# Patient Record
Sex: Male | Born: 1975 | Race: White | Hispanic: No | Marital: Married | Smoking: Never smoker
Health system: Southern US, Community
[De-identification: ages and names within clinical notes are randomized; demographics above are authoritative.]

## PROBLEM LIST (undated history)

## (undated) DIAGNOSIS — R569 Unspecified convulsions: Secondary | ICD-10-CM

## (undated) DIAGNOSIS — E785 Hyperlipidemia, unspecified: Secondary | ICD-10-CM

## (undated) HISTORY — PX: VASECTOMY: SHX75

## (undated) HISTORY — DX: Hyperlipidemia, unspecified: E78.5

## (undated) HISTORY — DX: Unspecified convulsions: R56.9

---

## 2013-05-02 ENCOUNTER — Ambulatory Visit (INDEPENDENT_AMBULATORY_CARE_PROVIDER_SITE_OTHER): Payer: Managed Care, Other (non HMO) | Admitting: Family Medicine

## 2013-05-02 ENCOUNTER — Encounter: Payer: Self-pay | Admitting: Family Medicine

## 2013-05-02 VITALS — BP 116/72 | HR 61 | Temp 98.3°F | Ht 66.0 in | Wt 173.4 lb

## 2013-05-02 DIAGNOSIS — Z0000 Encounter for general adult medical examination without abnormal findings: Secondary | ICD-10-CM

## 2013-05-02 LAB — POCT CBC
Granulocyte percent: 53.8 %G (ref 37–80)
HCT, POC: 43.4 % — AB (ref 43.5–53.7)
Hemoglobin: 14.9 g/dL (ref 14.1–18.1)
Lymph, poc: 2.3 (ref 0.6–3.4)
MCH, POC: 29.1 pg (ref 27–31.2)
MCHC: 34.3 g/dL (ref 31.8–35.4)
MCV: 84.8 fL (ref 80–97)
MPV: 8.4 fL (ref 0–99.8)
POC Granulocyte: 2.9 (ref 2–6.9)
POC LYMPH PERCENT: 42.9 %L (ref 10–50)
Platelet Count, POC: 240 10*3/uL (ref 142–424)
RBC: 5.1 M/uL (ref 4.69–6.13)
RDW, POC: 12.7 %
WBC: 5.3 10*3/uL (ref 4.6–10.2)

## 2013-05-02 NOTE — Progress Notes (Signed)
  Subjective:    Patient ID: Shaun Garcia, male    DOB: Aug 23, 1975, 37 y.o.   MRN: [id number]  HPI  This 37 y.o. male presents for evaluation of annual exam.  He has no acute complaints.  Review of Systems    No chest pain, SOB, HA, dizziness, vision change, N/V, diarrhea, constipation, dysuria, urinary urgency or frequency, myalgias, arthralgias or rash.  Objective:   Physical Exam  Vital signs noted  Well developed well nourished male.  HEENT - Head atraumatic Normocephalic                Eyes - PERRLA, Conjuctiva - clear Sclera- Clear EOMI                Ears - EAC's Wnl TM's Wnl Gross Hearing WNL                Nose - Nares patent                 Throat - oropharanx wnl Respiratory - Lungs CTA bilateral Cardiac - RRR S1 and S2 without murmur GI - Abdomen soft Nontender and bowel sounds active x 4 Extremities - No edema. Neuro - Grossly intact.      Assessment & Plan:  Routine general medical examination at a health care facility - Plan: POCT CBC, CMP14+EGFR, Lipid panel, Thyroid Panel With TSH

## 2013-05-02 NOTE — Patient Instructions (Signed)
Testicular Problems and Self-Exam   Men can examine themselves easily and effectively with positive results. Monthly exams detect problems early and save lives. There are numerous causes of swelling in the testicle. Testicular cancer usually appears as a firm painless lump in the front part of the testicle. This may feel like a dull ache or heavy feeling located in the lower abdomen (belly), groin, or scrotum.   The risk is greater in men with undescended testicles and it is more common in young men. It is responsible for almost a fifth of cancers in males between ages 15 and 34. Other common causes of swellings, lumps, and testicular pain include injuries, inflammation (soreness) from infection, hydrocele, and torsion. These are a few of the reasons to do monthly self-examination of the testicles. The exam only takes minutes and could add years to your life. Get in the habit!   SELF-EXAMINATION OF THE TESTICLES   The testicles are easiest to examine after warm baths or showers and are more difficult to examine when you are cold. This is because the muscles attached to the testicles retract and pull them up higher or into the abdomen. While standing, roll one testicle between the thumb and forefinger. Feel for lumps, swelling, or discomfort. A normal testicle is egg shaped and feels firm. It is smooth and not tender. The spermatic cord can be felt as a firm spaghetti-like cord at the back of the testicle. It is also important to examine your groins. This is the crease between the front of your leg and your abdomen. Also, feel for enlarged lymph nodes (glands). Enlarged nodes are also a cause for you to see your caregiver for evaluation.   Self-examination of the testicles and groin areas on a regular basis will help you to know what your own testicles and groins feel like. This will help you pick up an abnormality (difference) at an earlier stage. Early discovery is the key to curing this cancer or treating other  conditions. Any lump, change, or swelling in the testicle calls for immediate evaluation by your caregiver. Cancer of the testicle does not result in impotence and it does not prevent normal intercourse or prevent having children. If your caregiver feels that medical treatment or chemotherapy could lead to infertility, sperm can be frozen for future use. It is necessary to see a caregiver as soon as possible after the discovery of a lump in a testicle.   Document Released: 11/07/2000 Document Revised: 10/24/2011 Document Reviewed: 08/02/2008   [employer]® Patient Information ©2014 [employer].

## 2013-05-03 LAB — THYROID PANEL WITH TSH
Free Thyroxine Index: 1.6 (ref 1.2–4.9)
T3 Uptake Ratio: 32 % (ref 24–39)
T4, Total: 4.9 ug/dL (ref 4.5–12.0)
TSH: 0.697 u[IU]/mL (ref 0.450–4.500)

## 2013-05-03 LAB — CMP14+EGFR
ALT: 30 IU/L (ref 0–44)
AST: 26 IU/L (ref 0–40)
Albumin/Globulin Ratio: 1.9 (ref 1.1–2.5)
Albumin: 4.7 g/dL (ref 3.5–5.5)
Alkaline Phosphatase: 42 IU/L (ref 39–117)
BUN/Creatinine Ratio: 14 (ref 8–19)
BUN: 15 mg/dL (ref 6–20)
CO2: 28 mmol/L (ref 18–29)
Calcium: 10.1 mg/dL (ref 8.7–10.2)
Chloride: 99 mmol/L (ref 97–108)
Creatinine, Ser: 1.08 mg/dL (ref 0.76–1.27)
GFR calc Af Amer: 102 mL/min/{1.73_m2} (ref >59)
GFR calc non Af Amer: 88 mL/min/{1.73_m2} (ref >59)
Globulin, Total: 2.5 g/dL (ref 1.5–4.5)
Glucose: 73 mg/dL (ref 65–99)
Potassium: 4.4 mmol/L (ref 3.5–5.2)
Sodium: 143 mmol/L (ref 134–144)
Total Bilirubin: 0.4 mg/dL (ref 0.0–1.2)
Total Protein: 7.2 g/dL (ref 6.0–8.5)

## 2013-05-03 LAB — LIPID PANEL
Chol/HDL Ratio: 5.5 ratio units — ABNORMAL HIGH (ref 0.0–5.0)
Cholesterol, Total: 249 mg/dL — ABNORMAL HIGH (ref 100–199)
HDL: 45 mg/dL (ref >39)
LDL Calculated: 148 mg/dL — ABNORMAL HIGH (ref 0–99)
Triglycerides: 280 mg/dL — ABNORMAL HIGH (ref 0–149)
VLDL Cholesterol Cal: 56 mg/dL — ABNORMAL HIGH (ref 5–40)

## 2013-05-07 ENCOUNTER — Encounter: Payer: Self-pay | Admitting: *Deleted

## 2013-05-10 ENCOUNTER — Telehealth: Payer: Self-pay | Admitting: Family Medicine

## 2013-05-10 NOTE — Telephone Encounter (Signed)
Pt notified and lab results left on his answering machine as requested.

## 2014-02-05 ENCOUNTER — Encounter: Payer: Self-pay | Admitting: Family Medicine

## 2014-02-05 ENCOUNTER — Ambulatory Visit (INDEPENDENT_AMBULATORY_CARE_PROVIDER_SITE_OTHER): Payer: BC Managed Care – PPO | Admitting: Family Medicine

## 2014-02-05 VITALS — BP 115/74 | HR 70 | Temp 99.1°F | Ht 66.0 in | Wt 174.2 lb

## 2014-02-05 DIAGNOSIS — H109 Unspecified conjunctivitis: Secondary | ICD-10-CM

## 2014-02-05 DIAGNOSIS — J069 Acute upper respiratory infection, unspecified: Secondary | ICD-10-CM

## 2014-02-05 MED ORDER — METHYLPREDNISOLONE ACETATE 80 MG/ML IJ SUSP
80.0000 mg | Freq: Once | INTRAMUSCULAR | Status: AC
  Administered 2014-02-05: 80 mg via INTRAMUSCULAR

## 2014-02-05 MED ORDER — POLYMYXIN B-TRIMETHOPRIM 10000-0.1 UNIT/ML-% OP SOLN: 1.0000 [drp] | OPHTHALMIC | Status: DC

## 2014-02-05 MED ORDER — AZITHROMYCIN 250 MG PO TABS: ORAL_TABLET | ORAL | Status: DC

## 2014-02-05 NOTE — Progress Notes (Signed)
   Subjective:    Patient ID: Caius Silbernagel, male    DOB: [birth year], 38 y.o.   MRN: [id number]  HPI This 38 y.o. male presents for evaluation of bilateral eye redness, uri sx's, and fatigue.   Review of Systems C/o uri sx's No chest pain, SOB, HA, dizziness, vision change, N/V, diarrhea, constipation, dysuria, urinary urgency or frequency, myalgias, arthralgias or rash.     Objective:   Physical Exam  Vital signs noted  Well developed well nourished male.  HEENT - Head atraumatic Normocephalic                Eyes - PERRLA, Conjuctiva - injected Sclera- Clear EOMI peri-orbital edema                Ears - EAC's Wnl TM's Wnl Gross Hearing WNL                Nose - Nares patent                 Throat - oropharanx wnl Respiratory - Lungs CTA bilateral Cardiac - RRR S1 and S2 without murmur GI - Abdomen soft Nontender and bowel sounds active x 4      Assessment & Plan:  Bilateral conjunctivitis - Plan: methylPREDNISolone acetate (DEPO-MEDROL) injection 80 mg, azithromycin (ZITHROMAX) 250 MG tablet, trimethoprim-polymyxin b (POLYTRIM) ophthalmic solution  URI (upper respiratory infection) - Plan: methylPREDNISolone acetate (DEPO-MEDROL) injection 80 mg, azithromycin (ZITHROMAX) 250 MG tablet, trimethoprim-polymyxin b (POLYTRIM) ophthalmic solution  Push po fluids, rest, tylenol and motrin otc prn as directed for fever, arthralgias, and myalgias.  Follow up prn if sx's continue or persist.  Lysbeth Penner FNP

## 2014-12-02 ENCOUNTER — Encounter: Payer: Self-pay | Admitting: Family Medicine

## 2014-12-02 ENCOUNTER — Ambulatory Visit (INDEPENDENT_AMBULATORY_CARE_PROVIDER_SITE_OTHER): Payer: BLUE CROSS/BLUE SHIELD | Admitting: Family Medicine

## 2014-12-02 VITALS — BP 122/79 | HR 58 | Temp 98.0°F | Ht 66.0 in | Wt 177.0 lb

## 2014-12-02 DIAGNOSIS — Z23 Encounter for immunization: Secondary | ICD-10-CM

## 2014-12-02 DIAGNOSIS — Z0000 Encounter for general adult medical examination without abnormal findings: Secondary | ICD-10-CM

## 2014-12-02 DIAGNOSIS — E785 Hyperlipidemia, unspecified: Secondary | ICD-10-CM

## 2014-12-02 DIAGNOSIS — R569 Unspecified convulsions: Secondary | ICD-10-CM | POA: Insufficient documentation

## 2014-12-02 DIAGNOSIS — E559 Vitamin D deficiency, unspecified: Secondary | ICD-10-CM

## 2014-12-02 NOTE — Progress Notes (Signed)
Subjective:  Patient ID: Ronnel Zuercher, male    DOB: 04-Sep-1975  Age: 39 y.o. MRN: [id number]  CC: Annual Exam   HPI Darriel Utter presents for annual exam  History Rowland has a past medical history of Seizure (last 2002).   He has past surgical history that includes Vasectomy.   His family history includes Heart disease in his mother; Hypertension in his father.He reports that he has never smoked. His smokeless tobacco use includes Snuff. He reports that he drinks alcohol. He reports that he does not use illicit drugs.  No current outpatient prescriptions on file prior to visit.   No current facility-administered medications on file prior to visit.    ROS Review of Systems  Constitutional: Negative for fever, chills, diaphoresis, activity change, appetite change, fatigue and unexpected weight change.  HENT: Negative for congestion, ear pain, hearing loss, postnasal drip, rhinorrhea, sore throat, tinnitus and trouble swallowing.   Eyes: Negative for photophobia, pain, discharge and redness.  Respiratory: Negative for apnea, cough, choking, chest tightness, shortness of breath, wheezing and stridor.   Cardiovascular: Negative for chest pain, palpitations and leg swelling.  Gastrointestinal: Negative for nausea, vomiting, abdominal pain, diarrhea, constipation, blood in stool and abdominal distention.  Endocrine: Negative for cold intolerance, heat intolerance, polydipsia, polyphagia and polyuria.  Genitourinary: Negative for dysuria, urgency, frequency, hematuria, flank pain, enuresis, difficulty urinating and genital sores.  Musculoskeletal: Negative for joint swelling and arthralgias.  Skin: Negative for color change, rash and wound.  Allergic/Immunologic: Negative for immunocompromised state.  Neurological: Negative for dizziness, tremors, seizures, syncope, facial asymmetry, speech difficulty, weakness, light-headedness, numbness and headaches.  Hematological: Does not  bruise/bleed easily.  Psychiatric/Behavioral: Negative for suicidal ideas, hallucinations, behavioral problems, confusion, sleep disturbance, dysphoric mood, decreased concentration and agitation. The patient is not nervous/anxious and is not hyperactive.     Objective:  BP 122/79 mmHg  Pulse 58  Temp(Src) 98 F (36.7 C) (Oral)  Ht _0  (1.676 m)  Wt 177 lb (80.287 kg)  BMI 28.58 kg/m2  Physical Exam  Constitutional: He is oriented to person, place, and time. He appears well-developed and well-nourished.  HENT:  Head: Normocephalic and atraumatic.  Mouth/Throat: Oropharynx is clear and moist.  Eyes: EOM are normal. Pupils are equal, round, and reactive to light.  Neck: Normal range of motion. No tracheal deviation present. No thyromegaly present.  Cardiovascular: Normal rate, regular rhythm and normal heart sounds.  Exam reveals no gallop and no friction rub.   No murmur heard. Pulmonary/Chest: Breath sounds normal. He has no wheezes. He has no rales.  Abdominal: Soft. He exhibits no mass. There is no tenderness. There is no rebound and no guarding.  Genitourinary: Penis normal.  Musculoskeletal: Normal range of motion. He exhibits no edema.  Neurological: He is alert and oriented to person, place, and time.  Skin: Skin is warm and dry.  Psychiatric: He has a normal mood and affect.    Assessment & Plan:   Barkley was seen today for annual exam.  Diagnoses and all orders for this visit:  Wellness examination Orders: -     Cancel: POCT CBC -     Cancel: CMP14+EGFR -     Cancel: NMR, lipoprofile -     Cancel: Vit D  25 hydroxy (rtn osteoporosis monitoring) -     POCT CBC; Future -     CMP14+EGFR; Future  Hyperlipidemia Orders: -     Cancel: CMP14+EGFR -     Cancel: NMR, lipoprofile -  POCT CBC; Future -     CMP14+EGFR; Future -     NMR, lipoprofile; Future  Vitamin D deficiency Orders: -     Cancel: POCT CBC -     Cancel: Vit D  25 hydroxy (rtn osteoporosis  monitoring) -     POCT CBC; Future -     CMP14+EGFR; Future -     Vit D  25 hydroxy (rtn osteoporosis monitoring); Future  Other orders -     Tdap vaccine greater than or equal to 7yo IM   I have discontinued Mr. Rackers's azithromycin and trimethoprim-polymyxin b.  No orders of the defined types were placed in this encounter.    Handout regarding tetanus vaccine/TDaP given and vaccine administered.  Patient was counseled on appropriate diet. Low carbohydrate, low sodium, high protein approach.   Regular exercise benefit with mix of cardio and resistance training was reviewed. Appropriate weight loss reviewed including reducing true way size to 36 due to height  Reminded to wear seat belt when driving or a passenger.  Follow-up: Return in about 1 year (around 12/02/2015).  Claretta Fraise, M.D.

## 2014-12-02 NOTE — Patient Instructions (Signed)

## 2014-12-06 ENCOUNTER — Other Ambulatory Visit (INDEPENDENT_AMBULATORY_CARE_PROVIDER_SITE_OTHER): Payer: BLUE CROSS/BLUE SHIELD

## 2014-12-06 DIAGNOSIS — E559 Vitamin D deficiency, unspecified: Secondary | ICD-10-CM | POA: Diagnosis not present

## 2014-12-06 DIAGNOSIS — E785 Hyperlipidemia, unspecified: Secondary | ICD-10-CM | POA: Diagnosis not present

## 2014-12-06 DIAGNOSIS — Z0000 Encounter for general adult medical examination without abnormal findings: Secondary | ICD-10-CM | POA: Diagnosis not present

## 2014-12-06 LAB — POCT CBC
GRANULOCYTE PERCENT: 48.8 % (ref 37–80)
HEMATOCRIT: 44.8 % (ref 43.5–53.7)
Hemoglobin: 14.3 g/dL (ref 14.1–18.1)
LYMPH, POC: 1.9 (ref 0.6–3.4)
MCH, POC: 27.7 pg (ref 27–31.2)
MCHC: 32 g/dL (ref 31.8–35.4)
MCV: 86.5 fL (ref 80–97)
MPV: 8.8 fL (ref 0–99.8)
PLATELET COUNT, POC: 239 10*3/uL (ref 142–424)
POC Granulocyte: 2.1 (ref 2–6.9)
POC LYMPH PERCENT: 43.6 %L (ref 10–50)
RBC: 5.17 M/uL (ref 4.69–6.13)
RDW, POC: 12.5 %
WBC: 4.4 10*3/uL — AB (ref 4.6–10.2)

## 2014-12-09 ENCOUNTER — Other Ambulatory Visit: Payer: Self-pay | Admitting: Family Medicine

## 2014-12-09 LAB — NMR, LIPOPROFILE
Cholesterol: 188 mg/dL (ref 100–199)
HDL CHOLESTEROL BY NMR: 48 mg/dL (ref >39)
HDL Particle Number: 30.9 umol/L (ref >=30.5)
LDL Particle Number: 1823 nmol/L — ABNORMAL HIGH (ref <1000)
LDL Size: 20.7 nm (ref >20.5)
LDL-C: 124 mg/dL — AB (ref 0–99)
LP-IR Score: 43 (ref <=45)
SMALL LDL PARTICLE NUMBER: 948 nmol/L — AB (ref <=527)
TRIGLYCERIDES BY NMR: 81 mg/dL (ref 0–149)

## 2014-12-09 LAB — CMP14+EGFR
A/G RATIO: 2 (ref 1.1–2.5)
ALBUMIN: 4.5 g/dL (ref 3.5–5.5)
ALT: 17 IU/L (ref 0–44)
AST: 15 IU/L (ref 0–40)
Alkaline Phosphatase: 49 IU/L (ref 39–117)
BILIRUBIN TOTAL: 0.3 mg/dL (ref 0.0–1.2)
BUN / CREAT RATIO: 13 (ref 8–19)
BUN: 15 mg/dL (ref 6–20)
CO2: 24 mmol/L (ref 18–29)
Calcium: 9.1 mg/dL (ref 8.7–10.2)
Chloride: 100 mmol/L (ref 97–108)
Creatinine, Ser: 1.13 mg/dL (ref 0.76–1.27)
GFR calc Af Amer: 95 mL/min/{1.73_m2} (ref >59)
GFR, EST NON AFRICAN AMERICAN: 83 mL/min/{1.73_m2} (ref >59)
Globulin, Total: 2.3 g/dL (ref 1.5–4.5)
Glucose: 107 mg/dL — ABNORMAL HIGH (ref 65–99)
Potassium: 4.5 mmol/L (ref 3.5–5.2)
Sodium: 139 mmol/L (ref 134–144)
TOTAL PROTEIN: 6.8 g/dL (ref 6.0–8.5)

## 2014-12-09 LAB — VITAMIN D 25 HYDROXY (VIT D DEFICIENCY, FRACTURES): Vit D, 25-Hydroxy: 25.9 ng/mL — ABNORMAL LOW (ref 30.0–100.0)

## 2014-12-09 MED ORDER — VITAMIN D (ERGOCALCIFEROL) 1.25 MG (50000 UNIT) PO CAPS: 50000.0000 [IU] | ORAL_CAPSULE | ORAL | Status: DC

## 2014-12-09 MED ORDER — ATORVASTATIN CALCIUM 40 MG PO TABS: 40.0000 mg | ORAL_TABLET | Freq: Every day | ORAL | Status: DC

## 2014-12-10 ENCOUNTER — Telehealth: Payer: Self-pay | Admitting: *Deleted

## 2014-12-10 NOTE — Telephone Encounter (Signed)
-----   Message from Claretta Fraise, MD sent at 12/09/2014  8:46 PM EDT ----- Your cholesterol is too high. It would benefit from medication. Without treatment, risk for heart attack, stroke and other medical conditions is high. I recommend atorvastatin. 40 mg daily with supper should reduce your risk significantly.   Vitamin D quite low. Needs supplement.Use 50,000 units twice weekly for two months. Then switch to OTC Vitamin D 2000 units daily. REcehck vitamin D level with office visit in 6 months.

## 2014-12-10 NOTE — Telephone Encounter (Signed)
Pt notified of results Verbalizes understanding 

## 2014-12-22 ENCOUNTER — Telehealth: Payer: Self-pay | Admitting: Family Medicine

## 2014-12-22 MED ORDER — ROSUVASTATIN CALCIUM 10 MG PO TABS: 10.0000 mg | ORAL_TABLET | Freq: Every day | ORAL | Status: DC

## 2014-12-22 NOTE — Telephone Encounter (Signed)
Have him discontinue the atorvastatin. He should start on Crestor to take its place. Tell him that Crestor has a coupon to decrease the expense of the brand name product. I would like for him to wait about a week to let the atorvastatin wash out of his system before starting the Crestor. Sorry for the complication. Thanks, WS

## 2014-12-22 NOTE — Telephone Encounter (Signed)
Patient has been having joint pain since starting statin.  What are your suggestions?

## 2014-12-22 NOTE — Telephone Encounter (Signed)
Patient aware and verbalizes understanding and coupon for crestor has been left up front for patient to pick up.

## 2014-12-25 ENCOUNTER — Ambulatory Visit (INDEPENDENT_AMBULATORY_CARE_PROVIDER_SITE_OTHER): Payer: BLUE CROSS/BLUE SHIELD

## 2014-12-25 ENCOUNTER — Encounter: Payer: Self-pay | Admitting: Physician Assistant

## 2014-12-25 ENCOUNTER — Ambulatory Visit (INDEPENDENT_AMBULATORY_CARE_PROVIDER_SITE_OTHER): Payer: BLUE CROSS/BLUE SHIELD | Admitting: Physician Assistant

## 2014-12-25 VITALS — BP 115/72 | HR 65 | Temp 98.0°F | Ht 66.0 in | Wt 173.0 lb

## 2014-12-25 DIAGNOSIS — R062 Wheezing: Secondary | ICD-10-CM

## 2014-12-25 DIAGNOSIS — R509 Fever, unspecified: Secondary | ICD-10-CM

## 2014-12-25 DIAGNOSIS — J020 Streptococcal pharyngitis: Secondary | ICD-10-CM | POA: Diagnosis not present

## 2014-12-25 DIAGNOSIS — J189 Pneumonia, unspecified organism: Secondary | ICD-10-CM

## 2014-12-25 LAB — POCT INFLUENZA A/B
Influenza A, POC: NEGATIVE
Influenza B, POC: NEGATIVE

## 2014-12-25 LAB — POCT RAPID STREP A (OFFICE): Rapid Strep A Screen: NEGATIVE

## 2014-12-25 MED ORDER — ALBUTEROL SULFATE HFA 108 (90 BASE) MCG/ACT IN AERS: 2.0000 | INHALATION_SPRAY | Freq: Four times a day (QID) | RESPIRATORY_TRACT | Status: DC | PRN

## 2014-12-25 MED ORDER — LEVOFLOXACIN 500 MG PO TABS: 500.0000 mg | ORAL_TABLET | Freq: Every day | ORAL | Status: DC

## 2014-12-25 MED ORDER — PREDNISONE 10 MG (21) PO TBPK: ORAL_TABLET | ORAL | Status: DC

## 2014-12-25 NOTE — Patient Instructions (Signed)
Take Gerber as directed to thin congestion, drink plenty of water.    Pneumonia, Adult Pneumonia is an infection of the lungs. It may be caused by a germ (virus or bacteria). Some types of pneumonia can spread easily from person to person. This can happen when you cough or sneeze. HOME CARE  Only take medicine as told by your doctor.  Take your medicine (antibiotics) as told. Finish it even if you start to feel better.  Do not smoke.  You may use a vaporizer or humidifier in your room. This can help loosen thick spit (mucus).  Sleep so you are almost sitting up (semi-upright). This helps reduce coughing.  Rest. A shot (vaccine) can help prevent pneumonia. Shots are often advised for:  People over 40 years old.  Patients on chemotherapy.  People with long-term (chronic) lung problems.  People with immune system problems. GET HELP RIGHT AWAY IF:   You are getting worse.  You cannot control your cough, and you are losing sleep.  You cough up blood.  Your pain gets worse, even with medicine.  You have a fever.  Any of your problems are getting worse, not better.  You have shortness of breath or chest pain. MAKE SURE YOU:   Understand these instructions.  Will watch your condition.  Will get help right away if you are not doing well or get worse. Document Released: 01/18/2008 Document Revised: 10/24/2011 Document Reviewed: 10/22/2010 [employer] Patient Information 2015 [place], [place]. This information is not intended to replace advice given to you by your health care provider. Make sure you discuss any questions you have with your health care provider.

## 2014-12-25 NOTE — Progress Notes (Signed)
   Subjective:    Patient ID: Shaun Garcia, male    DOB: 18-Feb-1976, 39 y.o.   MRN: [id number]  HPI 39 y/o male presents wit hc/o nasal and chest congestion x 4 days. Productive cough. Has tried theraflu and robitussin with some relief     Review of Systems  Constitutional: Positive for fever (low grade 99.5), chills, diaphoresis and fatigue.  HENT: Positive for congestion (nasal and chest ), ear pain (bilateral ), sinus pressure and sore throat (initially ). Negative for postnasal drip and rhinorrhea.   Eyes: Negative.   Respiratory: Positive for cough (productive ) and wheezing. Negative for shortness of breath.   Cardiovascular: Negative.   Musculoskeletal: Positive for myalgias.  All other systems reviewed and are negative.      Objective:   Physical Exam  Constitutional: He is oriented to person, place, and time. He appears well-developed and well-nourished. No distress.  HENT:  Head: Normocephalic.  Posterior pharynx erythema bilaterally   Cardiovascular: Normal rate, regular rhythm and normal heart sounds.  Exam reveals no gallop and no friction rub.   No murmur heard. Pulmonary/Chest: Effort normal. He has wheezes (generalized expiratory wheezes throughout bilateral lobes). He has no rales.  Neurological: He is alert and oriented to person, place, and time.  Skin: He is not diaphoretic.  Psychiatric: He has a normal mood and affect. His behavior is normal. Judgment and thought content normal.  Nursing note and vitals reviewed.         Assessment & Plan:  1. Fever chills  - POCT Influenza A/B  2. Streptococcal sore throat  - POCT rapid strep A - Culture, Group A Strep  3. Wheezing  - DG Chest 2 View - levofloxacin (LEVAQUIN) 500 MG tablet; Take 1 tablet (500 mg total) by mouth daily.  Dispense: 7 tablet; Refill: 0 - albuterol (PROVENTIL HFA;VENTOLIN HFA) 108 (90 BASE) MCG/ACT inhaler; Inhale 2 puffs into the lungs every 6 (six) hours as needed for wheezing  or shortness of breath.  Dispense: 1 Inhaler; Refill: 0 - predniSONE (STERAPRED UNI-PAK 21 TAB) 10 MG (21) TBPK tablet; Take as directed  Dispense: 21 tablet; Refill: 0  4. CAP (community acquired pneumonia)  - levofloxacin (LEVAQUIN) 500 MG tablet; Take 1 tablet (500 mg total) by mouth daily.  Dispense: 7 tablet; Refill: 0 - albuterol (PROVENTIL HFA;VENTOLIN HFA) 108 (90 BASE) MCG/ACT inhaler; Inhale 2 puffs into the lungs every 6 (six) hours as needed for wheezing or shortness of breath.  Dispense: 1 Inhaler; Refill: 0 - predniSONE (STERAPRED UNI-PAK 21 TAB) 10 MG (21) TBPK tablet; Take as directed  Dispense: 21 tablet; Refill: 0   - OTC plain Musinex as direct with plenty of fluids - Rest - Cool mist humidifier RTO 1 week  Megahn Killings A. Benjamin Stain PA-C

## 2014-12-28 LAB — CULTURE, GROUP A STREP

## 2015-01-01 ENCOUNTER — Ambulatory Visit (INDEPENDENT_AMBULATORY_CARE_PROVIDER_SITE_OTHER): Payer: BLUE CROSS/BLUE SHIELD | Admitting: Physician Assistant

## 2015-01-01 ENCOUNTER — Encounter: Payer: Self-pay | Admitting: Physician Assistant

## 2015-01-01 VITALS — BP 124/81 | HR 74 | Temp 97.7°F | Ht 66.0 in | Wt 177.2 lb

## 2015-01-01 DIAGNOSIS — J40 Bronchitis, not specified as acute or chronic: Secondary | ICD-10-CM | POA: Diagnosis not present

## 2015-01-01 DIAGNOSIS — R062 Wheezing: Secondary | ICD-10-CM

## 2015-01-01 DIAGNOSIS — G44211 Episodic tension-type headache, intractable: Secondary | ICD-10-CM | POA: Diagnosis not present

## 2015-01-01 NOTE — Progress Notes (Signed)
   Subjective:    Patient ID: Shaun Garcia, male    DOB: [birth year], 39 y.o.   MRN: [id number]  HPI 39 y/o male presents for 1 week f/u of bronchitis. He has completed Levaquin, sterapred and is only using the albuterol inhaler prn. He states that he is much improved.     Review of Systems  Respiratory: Positive for cough (occasionaly, nonproductive ).   Neurological: Positive for dizziness (occasional dizziness with sitting to standing position One or less times daily ) and headaches (posterior, neck area. relieved with Aleve ). Negative for speech difficulty, light-headedness and numbness.  All other systems reviewed and are negative.      Objective:   Physical Exam  Constitutional: He is oriented to person, place, and time. He appears well-developed and well-nourished. No distress.  HENT:  Head: Normocephalic.  Mouth/Throat: No oropharyngeal exudate.  Cardiovascular: Normal rate, regular rhythm and normal heart sounds.  Exam reveals no gallop and no friction rub.   No murmur heard. Pulmonary/Chest: Effort normal and breath sounds normal. No respiratory distress. He has no wheezes. He has no rales. He exhibits no tenderness.  Neurological: He is alert and oriented to person, place, and time.  Skin: He is not diaphoretic.  Psychiatric: He has a normal mood and affect. His behavior is normal. Judgment and thought content normal.  Vitals reviewed.         Assessment & Plan:  1. Bronchitis - Resolved. Continue to take plain musinex if you feel congested. Drink plenty of water  2. Wheeze - Use albuterol inhaler prn for wheeze - Much improved on today's visit   3. Headache - Take Aleve or NSAID as directed - I feel that this is more tension due to patient's c/o increased stress, or caffeine related. Advised patient to f/u for further eval if headaches continue.   F/U prn      Raliyah Montella A. Lynett Brasil PA-C    Amaar Oshita A. Benjamin Stain PA-C

## 2015-06-22 ENCOUNTER — Ambulatory Visit (INDEPENDENT_AMBULATORY_CARE_PROVIDER_SITE_OTHER): Payer: BLUE CROSS/BLUE SHIELD | Admitting: Family Medicine

## 2015-06-22 ENCOUNTER — Ambulatory Visit: Payer: BLUE CROSS/BLUE SHIELD | Admitting: Family Medicine

## 2015-06-22 ENCOUNTER — Encounter: Payer: Self-pay | Admitting: Family Medicine

## 2015-06-22 VITALS — BP 118/75 | HR 60 | Temp 98.6°F | Ht 66.0 in | Wt 172.4 lb

## 2015-06-22 DIAGNOSIS — J069 Acute upper respiratory infection, unspecified: Secondary | ICD-10-CM | POA: Diagnosis not present

## 2015-06-22 LAB — POCT INFLUENZA A/B
INFLUENZA A, POC: NEGATIVE
Influenza B, POC: NEGATIVE

## 2015-06-22 NOTE — Progress Notes (Signed)
BP 118/75 mmHg  Pulse 60  Temp(Src) 98.6 F (37 C) (Oral)  Ht 5\' 6"  (1.676 m)  Wt 172 lb 6.4 oz (78.2 kg)  BMI 27.84 kg/m2   Subjective:    Patient ID: Shaun Garcia, male    DOB: Aug 27, 1975, 39 y.o.   MRN: [id number]  HPI: Faust Thorington is a 39 y.o. male presenting on 06/22/2015 for Cough; Chest congestion; and Sinusitis   HPI Cough congestion and sinus pressure Patient has been having a cough which is nonproductive and chest congestion and sinus pressure and postnasal drainage and a frontal headache. This is been going on for the past 4 days. He has had a subjective fever and chills. He has an albuterol inhaler that he has been using occasionally. He has not had the flu shot yet.  Relevant past medical, surgical, family and social history reviewed and updated as indicated. Interim medical history since our last visit reviewed. Allergies and medications reviewed and updated.  Review of Systems  Constitutional: Positive for fever. Negative for chills.  HENT: Positive for congestion, postnasal drip, rhinorrhea, sinus pressure and sore throat. Negative for ear discharge, ear pain, sneezing and voice change.   Eyes: Negative for pain, discharge, redness and visual disturbance.  Respiratory: Positive for cough. Negative for chest tightness, shortness of breath and wheezing.   Cardiovascular: Negative for chest pain and leg swelling.  Gastrointestinal: Negative for abdominal pain, diarrhea and constipation.  Genitourinary: Negative for difficulty urinating.  Musculoskeletal: Negative for back pain and gait problem.  Skin: Negative for rash.  Neurological: Negative for syncope, light-headedness and headaches.  All other systems reviewed and are negative.   Per HPI unless specifically indicated above     Medication List       This list is accurate as of: 06/22/15  9:34 AM.  Always use your most recent med list.               albuterol 108 (90 BASE) MCG/ACT inhaler    Commonly known as:  PROVENTIL HFA;VENTOLIN HFA  Inhale 2 puffs into the lungs every 6 (six) hours as needed for wheezing or shortness of breath.     rosuvastatin 10 MG tablet  Commonly known as:  CRESTOR  Take 1 tablet (10 mg total) by mouth daily.           Objective:    BP 118/75 mmHg  Pulse 60  Temp(Src) 98.6 F (37 C) (Oral)  Ht 5\' 6"  (1.676 m)  Wt 172 lb 6.4 oz (78.2 kg)  BMI 27.84 kg/m2  Wt Readings from Last 3 Encounters:  06/22/15 172 lb 6.4 oz (78.2 kg)  01/01/15 177 lb 3.2 oz (80.377 kg)  12/25/14 173 lb (78.472 kg)    Physical Exam  Constitutional: He is oriented to person, place, and time. He appears well-developed and well-nourished. No distress.  HENT:  Right Ear: Tympanic membrane, external ear and ear canal normal.  Left Ear: Tympanic membrane, external ear and ear canal normal.  Nose: Mucosal edema and rhinorrhea present. No sinus tenderness. No epistaxis. Right sinus exhibits maxillary sinus tenderness. Right sinus exhibits no frontal sinus tenderness. Left sinus exhibits maxillary sinus tenderness. Left sinus exhibits no frontal sinus tenderness.  Mouth/Throat: Uvula is midline and mucous membranes are normal. Posterior oropharyngeal edema and posterior oropharyngeal erythema present. No oropharyngeal exudate or tonsillar abscesses.  Eyes: Conjunctivae and EOM are normal. Pupils are equal, round, and reactive to light. Right eye exhibits no discharge. No scleral icterus.  Neck:  Neck supple. No thyromegaly present.  Cardiovascular: Normal rate, regular rhythm, normal heart sounds and intact distal pulses.   No murmur heard. Pulmonary/Chest: Effort normal and breath sounds normal. No respiratory distress. He has no wheezes.  Abdominal: He exhibits no distension.  Musculoskeletal: Normal range of motion. He exhibits no edema.  Lymphadenopathy:    He has no cervical adenopathy.  Neurological: He is alert and oriented to person, place, and time. Coordination  normal.  Skin: Skin is warm and dry. No rash noted. He is not diaphoretic.  Psychiatric: He has a normal mood and affect. His behavior is normal.  Nursing note and vitals reviewed.   Results for orders placed or performed in visit on 06/22/15  POCT Influenza A/B  Result Value Ref Range   Influenza A, POC Negative Negative   Influenza B, POC Negative Negative      Assessment & Plan:   Problem List Items Addressed This Visit    None    Visit Diagnoses    Acute upper respiratory infection    -  Primary    Likely viral, will use Flonase and antihistamine over-the-counter.    Relevant Orders    POCT Influenza A/B (Completed)        Follow up plan: Return if symptoms worsen or fail to improve.  Caryl Pina, MD [facility] 06/22/2015, 9:34 AM

## 2015-10-27 ENCOUNTER — Encounter: Payer: Self-pay | Admitting: Nurse Practitioner

## 2015-10-27 ENCOUNTER — Ambulatory Visit (INDEPENDENT_AMBULATORY_CARE_PROVIDER_SITE_OTHER): Payer: BLUE CROSS/BLUE SHIELD | Admitting: Nurse Practitioner

## 2015-10-27 VITALS — BP 127/80 | HR 66 | Temp 98.7°F | Ht 66.0 in | Wt 172.0 lb

## 2015-10-27 DIAGNOSIS — L989 Disorder of the skin and subcutaneous tissue, unspecified: Secondary | ICD-10-CM

## 2015-10-27 DIAGNOSIS — D485 Neoplasm of uncertain behavior of skin: Secondary | ICD-10-CM

## 2015-10-27 NOTE — Progress Notes (Signed)
   Subjective:    Patient ID: Shaun Garcia, male    DOB: 03-Apr-1976, 40 y.o.   MRN: [id number]  HPI Patient here today for mole removal.    Review of Systems  All other systems reviewed and are negative.      Objective:   Physical Exam  Constitutional: He appears well-developed and well-nourished. No distress.  Skin:  2cm slightly raised flesh colored lesion on right upper anterior chest wall     Procedure:  Lidocaine 1% with epi- 2cc local  Cleaned with betadine  punch removal  3-0 ethilon simple interrupted stitches X2  Cleaned with NACL  Antibiotic ointment and bandaid applied       Assessment & Plan:   1. Encounter for removal of skin lesion    Wound care instructions given RTO prn  Mary-Margaret Hassell Done, FNP

## 2015-10-27 NOTE — Patient Instructions (Addendum)
Thank you for allowing [place] to care for you today. We strive to provide exceptional quality and compassionate care. Please let us know how we are doing and how we can help serve you better by filling out the survey that you receive from [facility].    Wound Care Taking care of your wound properly can help to prevent pain and infection. It can also help your wound to heal more quickly.  HOW TO CARE FOR YOUR WOUND  Take or apply over-the-counter and prescription medicines only as told by your health care provider.  If you were prescribed antibiotic medicine, take or apply it as told by your health care provider. Do not stop using the antibiotic even if your condition improves.  Clean the wound each day or as told by your health care provider.  Wash the wound with mild soap and water.  Rinse the wound with water to remove all soap.  Pat the wound dry with a clean towel. Do not rub it.  There are many different ways to close and cover a wound. For example, a wound can be covered with stitches (sutures), skin glue, or adhesive strips. Follow instructions from your health care provider about:  How to take care of your wound.  When and how you should change your bandage (dressing).  When you should remove your dressing.  Removing whatever was used to close your wound.  Check your wound every day for signs of infection. Watch for:  Redness, swelling, or pain.  Fluid, blood, or pus.  Keep the dressing dry until your health care provider says it can be removed. Do not take baths, swim, use a hot tub, or do anything that would put your wound underwater until your health care provider approves.  Raise (elevate) the injured area above the level of your heart while you are sitting or lying down.  Do not scratch or pick at the wound.  Keep all follow-up visits as told by your health care provider. This is important. SEEK MEDICAL CARE IF:  You received a tetanus shot and you have  swelling, severe pain, redness, or bleeding at the injection site.  You have a fever.  Your pain is not controlled with medicine.  You have increased redness, swelling, or pain at the site of your wound.  You have fluid, blood, or pus coming from your wound.  You notice a bad smell coming from your wound or your dressing. SEEK IMMEDIATE MEDICAL CARE IF:  You have a red streak going away from your wound.   This information is not intended to replace advice given to you by your health care provider. Make sure you discuss any questions you have with your health care provider.   Document Released: 05/10/2008 Document Revised: 12/16/2014 Document Reviewed: 07/28/2014 Elsevier Interactive Patient Education [employer].

## 2015-10-29 LAB — PATHOLOGY

## 2016-05-03 ENCOUNTER — Ambulatory Visit (INDEPENDENT_AMBULATORY_CARE_PROVIDER_SITE_OTHER): Payer: BLUE CROSS/BLUE SHIELD | Admitting: Family Medicine

## 2016-05-03 ENCOUNTER — Encounter: Payer: Self-pay | Admitting: Family Medicine

## 2016-05-03 VITALS — BP 118/81 | HR 57 | Temp 98.4°F | Ht 66.0 in | Wt 177.1 lb

## 2016-05-03 DIAGNOSIS — K429 Umbilical hernia without obstruction or gangrene: Secondary | ICD-10-CM

## 2016-05-03 NOTE — Progress Notes (Signed)
BP 118/81   Pulse (!) 57   Temp 98.4 F (36.9 C) (Oral)   Ht 5\' 6"  (1.676 m)   Wt 177 lb 2 oz (80.3 kg)   BMI 28.59 kg/m    Subjective:    Patient ID: Shaun Garcia, male    DOB: [birth year], 40 y.o.   MRN: [id number]  HPI: Shaun Garcia is a 40 y.o. male presenting on [id number] for Umbilical Hernia (has been there for about 7 years)   HPI Umbilical hernia Patient has had a small umbilical hernia that he is known about for the past 7 years. He is coming in today because he feels like it is just gotten slightly larger and he is ready to get it fixed. He says that his children like to use it as his button and push on it all the time. He denies any pain or tenderness from it but is just ready to get it cleared up. He denies any constipation. He denies any blood in the stool  Relevant past medical, surgical, family and social history reviewed and updated as indicated. Interim medical history since our last visit reviewed. Allergies and medications reviewed and updated.  Review of Systems  Constitutional: Negative for chills and fever.  Eyes: Negative for discharge.  Respiratory: Negative for shortness of breath and wheezing.   Cardiovascular: Negative for chest pain and leg swelling.  Gastrointestinal:       Hernia  Musculoskeletal: Negative for back pain and gait problem.  Skin: Negative for rash.  All other systems reviewed and are negative.   Per HPI unless specifically indicated above     Medication List    as of 05/03/2016  4:57 PM   You have not been prescribed any medications.        Objective:    BP 118/81   Pulse (!) 57   Temp 98.4 F (36.9 C) (Oral)   Ht 5\' 6"  (1.676 m)   Wt 177 lb 2 oz (80.3 kg)   BMI 28.59 kg/m   Wt Readings from Last 3 Encounters:  05/03/16 177 lb 2 oz (80.3 kg)  10/27/15 172 lb (78 kg)  06/22/15 172 lb 6.4 oz (78.2 kg)    Physical Exam  Constitutional: He is oriented to person, place, and time. He appears well-developed and  well-nourished. No distress.  Eyes: Conjunctivae are normal. Right eye exhibits no discharge. No scleral icterus.  Cardiovascular: Normal rate, regular rhythm, normal heart sounds and intact distal pulses.   No murmur heard. Pulmonary/Chest: Effort normal and breath sounds normal. No respiratory distress. He has no wheezes.  Abdominal: There is no hepatosplenomegaly. There is no tenderness. There is no rebound. A hernia (Umbilical hernia, small, nontender and reducible) is present.  Musculoskeletal: Normal range of motion. He exhibits no edema.  Neurological: He is alert and oriented to person, place, and time. Coordination normal.  Skin: Skin is warm and dry. No rash noted. He is not diaphoretic.  Psychiatric: He has a normal mood and affect. His behavior is normal.  Nursing note and vitals reviewed.     Assessment & Plan:   Problem List Items Addressed This Visit    None    Visit Diagnoses    Umbilical hernia without obstruction and without gangrene    -  Primary   Relevant Orders   Ambulatory referral to General Surgery       Follow up plan: Return if symptoms worsen or fail to improve.  Counseling provided for all of  the vaccine components Orders Placed This Encounter  Procedures  . Ambulatory referral to [facility] Jhoana Upham, MD [facility] 05/03/2016, 4:57 PM

## 2016-05-09 ENCOUNTER — Ambulatory Visit: Payer: Self-pay | Admitting: Surgery

## 2016-05-09 NOTE — H&P (Signed)
Shaun Garcia. Shaun Garcia 2:36 PM Location: [facility] Patient #: [id number] DOB: 1975-08-17 Married / Language: Shaun Garcia / Race: White Garcia  History of Present Illness Shaun Garcia A. [name] MD; Garcia 2:51 PM) Patient words: umbilical hernia   Patient sent at the request of Dr.Dettinger for bulge at umbilicus. It has been present for 7 years. It is getting larger. It occurred while lifting some heavy tile he thinks. It is causing more discomfort especially with lifting, pushing or pulling. The area pops in and pops out at his umbilicus. There is been no drainage, redness or foul smell period.  The patient is a 40 year old Garcia.   Other Problems Shaun Garcia, CMA; Garcia 2:37 PM) Hypercholesterolemia Umbilical Hernia Repair  Past Surgical History Shaun Garcia, CMA; Garcia 2:36 PM) Oral Surgery  Diagnostic Studies History Shaun Garcia, [place]; Garcia 2:36 PM) Colonoscopy never  Allergies Shaun Garcia, CMA; Garcia 2:37 PM) Penicillin V *PENICILLINS*  Medication History Shaun Garcia, CMA; Garcia 2:37 PM) No Current Medications Medications Reconciled  Social History Shaun Garcia, CMA; Garcia 2:37 PM) Alcohol use Occasional alcohol use. Caffeine use Coffee, Tea. No drug use Tobacco use Former smoker.  Family History Shaun Garcia, [place]; Garcia 2:36 PM) Arthritis Father. Hypertension Father, Mother. Melanoma Father.     Review of Systems Shaun Garcia CMA; Garcia 2:37 PM) General Not Present- Appetite Loss, Chills, Fatigue, Fever, Night Sweats, Weight Gain and Weight Loss. Skin Not Present- Change in Wart/Mole, Dryness, Hives, Jaundice, New Lesions, Non-Healing Wounds, Rash and Ulcer. HEENT Not Present- Earache, Hearing Loss, Hoarseness, Nose Bleed, Oral Ulcers, Ringing in the Ears, Seasonal Allergies, Sinus Pain, Sore Throat, Visual Disturbances, Wears glasses/contact lenses and Yellow Eyes. Respiratory Not Present- Bloody  sputum, Chronic Cough, Difficulty Breathing, Snoring and Wheezing. Breast Not Present- Breast Mass, Breast Pain, Nipple Discharge and Skin Changes. Cardiovascular Not Present- Chest Pain, Difficulty Breathing Lying Down, Leg Cramps, Palpitations, Rapid Heart Rate, Shortness of Breath and Swelling of Extremities. Gastrointestinal Not Present- Abdominal Pain, Bloating, Bloody Stool, Change in Bowel Habits, Chronic diarrhea, Constipation, Difficulty Swallowing, Excessive gas, Gets full quickly at meals, Hemorrhoids, Indigestion, Nausea, Rectal Pain and Vomiting. Garcia Genitourinary Not Present- Blood in Urine, Change in Urinary Stream, Frequency, Impotence, Nocturia, Painful Urination, Urgency and Urine Leakage. Musculoskeletal Not Present- Back Pain, Joint Pain, Joint Stiffness, Muscle Pain, Muscle Weakness and Swelling of Extremities. Neurological Not Present- Decreased Memory, Fainting, Headaches, Numbness, Seizures, Tingling, Tremor, Trouble walking and Weakness. Psychiatric Not Present- Anxiety, Bipolar, Change in Sleep Pattern, Depression, Fearful and Frequent crying. Endocrine Not Present- Cold Intolerance, Excessive Hunger, Hair Changes, Heat Intolerance, Hot flashes and New Diabetes. Hematology Not Present- Blood Thinners, Easy Bruising, Excessive bleeding, Gland problems, HIV and Persistent Infections.  Vitals Shaun Garcia CMA; Garcia 2:37 PM) Garcia 2:37 PM Weight: 177 lb Height: 66in Body Surface Area: 1.9 m Body Mass Index: 28.57 kg/m  Temp.: 98.25F(Temporal)  Pulse: 78 (Regular)  BP: 124/86 (Sitting, Left Arm, Standard)      Physical Exam (Shaun Garcia A. [name] MD; Garcia 2:52 PM)  General Mental Status-Alert. General Appearance-Consistent with stated age. Hydration-Well hydrated. Voice-Normal.  Head and Neck Head-normocephalic, atraumatic with no lesions or palpable masses. Trachea-midline. Thyroid Gland Characteristics - normal size and  consistency.  Chest and Lung Exam Chest and lung exam reveals -quiet, even and easy respiratory effort with no use of accessory muscles and on auscultation, normal breath sounds, no adventitious sounds and normal vocal resonance. Inspection Chest Wall - Normal. Back - normal.  Cardiovascular Cardiovascular examination reveals -  normal heart sounds, regular rate and rhythm with no murmurs and normal pedal pulses bilaterally.  Abdomen Note: Reducible umbilical hernia. 3 cm in size. Soft nontender.  Neurologic Neurologic evaluation reveals -alert and oriented x 3 with no impairment of recent or remote memory. Mental Status-Normal.  Musculoskeletal Normal Exam - Left-Upper Extremity Strength Normal and Lower Extremity Strength Normal. Normal Exam - Right-Upper Extremity Strength Normal and Lower Extremity Strength Normal.    Assessment & Plan ([name] MD; [id number] PM)  UMBILICAL HERNIA WITHOUT OBSTRUCTION AND WITHOUT GANGRENE (K42.9) Impression: Discussed operative and nonoperative management of this problem. Pathophysiology discussed. Repair discussed with the use of mesh. The pros and cons of surgery discussed. Patient has opted to have his umbilical hernia repaired. The risk of hernia repair include bleeding, infection, organ injury, bowel injury, bladder injury, nerve injury recurrent hernia, blood clots, worsening of underlying condition, chronic pain, mesh use, open surgery, death, and the need for other operattions. Pt agrees to proceed  Current Plans You are being scheduled for surgery - Our schedulers will call you.  You should hear from our office's scheduling department within 5 working days about the location, date, and time of surgery. We try to make accommodations for patient's preferences in scheduling surgery, but sometimes the OR schedule or the surgeon's schedule prevents [place] from making those accommodations.  If you have not heard from our  office [phone]) in 5 working days, call the office and ask for your surgeon's nurse.  If you have other questions about your diagnosis, plan, or surgery, call the office and ask for your surgeon's nurse.  The anatomy & physiology of the abdominal wall was discussed. The pathophysiology of hernias was discussed. Natural history risks without surgery including progeressive enlargement, pain, incarceration, & strangulation was discussed. Contributors to complications such as smoking, obesity, diabetes, prior surgery, etc were discussed.  I feel the risks of no intervention will lead to serious problems that outweigh the operative risks; therefore, I recommended surgery to reduce and repair the hernia. I explained laparoscopic techniques with possible need for an open approach. I noted the probable use of mesh to patch and/or buttress the hernia repair  Risks such as bleeding, infection, abscess, need for further treatment, heart attack, death, and other risks were discussed. I noted a good likelihood this will help address the problem. Goals of post-operative recovery were discussed as well. Possibility that this will not correct all symptoms was explained. I stressed the importance of low-impact activity, aggressive pain control, avoiding constipation, & not pushing through pain to minimize risk of post-operative chronic pain or injury. Possibility of reherniation especially with smoking, obesity, diabetes, immunosuppression, and other health conditions was discussed. We will work to minimize complications.  An educational handout further explaining the pathology & treatment options was given as well. Questions were answered. The patient expresses understanding & wishes to proceed with surgery.  Pt Education - Pamphlet Given - Hernia Surgery: discussed with patient and provided information.

## 2016-06-01 ENCOUNTER — Encounter (HOSPITAL_BASED_OUTPATIENT_CLINIC_OR_DEPARTMENT_OTHER): Payer: Self-pay | Admitting: *Deleted

## 2016-06-03 ENCOUNTER — Encounter (HOSPITAL_BASED_OUTPATIENT_CLINIC_OR_DEPARTMENT_OTHER)
Admission: RE | Admit: 2016-06-03 | Discharge: 2016-06-03 | Disposition: A | Discharge status: Home / Self Care | Payer: Self-pay | Source: Ambulatory Visit | Attending: Surgery | Admitting: Surgery

## 2016-06-03 DIAGNOSIS — Z01812 Encounter for preprocedural laboratory examination: Secondary | ICD-10-CM | POA: Insufficient documentation

## 2016-06-03 DIAGNOSIS — K429 Umbilical hernia without obstruction or gangrene: Secondary | ICD-10-CM | POA: Insufficient documentation

## 2016-06-03 LAB — CBC WITH DIFFERENTIAL/PLATELET
BASOS PCT: 0 %
Basophils Absolute: 0 10*3/uL (ref 0.0–0.1)
Eosinophils Absolute: 0.1 10*3/uL (ref 0.0–0.7)
Eosinophils Relative: 2 %
HEMATOCRIT: 42 % (ref 39.0–52.0)
HEMOGLOBIN: 14.4 g/dL (ref 13.0–17.0)
LYMPHS ABS: 1.9 10*3/uL (ref 0.7–4.0)
Lymphocytes Relative: 26 %
MCH: 29.5 pg (ref 26.0–34.0)
MCHC: 34.3 g/dL (ref 30.0–36.0)
MCV: 86.1 fL (ref 78.0–100.0)
MONOS PCT: 7 %
Monocytes Absolute: 0.5 10*3/uL (ref 0.1–1.0)
NEUTROS ABS: 4.6 10*3/uL (ref 1.7–7.7)
NEUTROS PCT: 65 %
Platelets: 248 10*3/uL (ref 150–400)
RBC: 4.88 MIL/uL (ref 4.22–5.81)
RDW: 12.5 % (ref 11.5–15.5)
WBC: 7.1 10*3/uL (ref 4.0–10.5)

## 2016-06-03 LAB — COMPREHENSIVE METABOLIC PANEL
ALBUMIN: 4.3 g/dL (ref 3.5–5.0)
ALK PHOS: 45 U/L (ref 38–126)
ALT: 19 U/L (ref 17–63)
ANION GAP: 6 (ref 5–15)
AST: 25 U/L (ref 15–41)
BILIRUBIN TOTAL: 0.8 mg/dL (ref 0.3–1.2)
BUN: 12 mg/dL (ref 6–20)
CALCIUM: 9.2 mg/dL (ref 8.9–10.3)
CO2: 29 mmol/L (ref 22–32)
CREATININE: 1.12 mg/dL (ref 0.61–1.24)
Chloride: 103 mmol/L (ref 101–111)
GFR calc Af Amer: >60 mL/min (ref >60)
GFR calc non Af Amer: >60 mL/min (ref >60)
GLUCOSE: 62 mg/dL — AB (ref 65–99)
Potassium: 4 mmol/L (ref 3.5–5.1)
Sodium: 138 mmol/L (ref 135–145)
TOTAL PROTEIN: 7.3 g/dL (ref 6.5–8.1)

## 2016-06-09 ENCOUNTER — Ambulatory Visit (HOSPITAL_BASED_OUTPATIENT_CLINIC_OR_DEPARTMENT_OTHER): Payer: BLUE CROSS/BLUE SHIELD | Admitting: Anesthesiology

## 2016-06-09 ENCOUNTER — Encounter (HOSPITAL_BASED_OUTPATIENT_CLINIC_OR_DEPARTMENT_OTHER)
Admission: RE | Disposition: A | Discharge status: Home / Self Care | Payer: Self-pay | Source: Ambulatory Visit | Attending: Surgery

## 2016-06-09 ENCOUNTER — Ambulatory Visit (HOSPITAL_BASED_OUTPATIENT_CLINIC_OR_DEPARTMENT_OTHER)
Admission: RE | Admit: 2016-06-09 | Discharge: 2016-06-09 | Disposition: A | Discharge status: Home / Self Care | Payer: BLUE CROSS/BLUE SHIELD | Source: Ambulatory Visit | Attending: Surgery | Admitting: Surgery

## 2016-06-09 ENCOUNTER — Encounter (HOSPITAL_BASED_OUTPATIENT_CLINIC_OR_DEPARTMENT_OTHER): Payer: Self-pay | Admitting: *Deleted

## 2016-06-09 DIAGNOSIS — E7800 Pure hypercholesterolemia, unspecified: Secondary | ICD-10-CM | POA: Diagnosis not present

## 2016-06-09 DIAGNOSIS — Z87891 Personal history of nicotine dependence: Secondary | ICD-10-CM | POA: Insufficient documentation

## 2016-06-09 DIAGNOSIS — K429 Umbilical hernia without obstruction or gangrene: Secondary | ICD-10-CM | POA: Insufficient documentation

## 2016-06-09 HISTORY — PX: INSERTION OF MESH: SHX5868

## 2016-06-09 HISTORY — PX: UMBILICAL HERNIA REPAIR: SHX196

## 2016-06-09 SURGERY — REPAIR, HERNIA, UMBILICAL, ADULT
Anesthesia: General | Site: Abdomen

## 2016-06-09 MED ORDER — SCOPOLAMINE 1 MG/3DAYS TD PT72
1.0000 | MEDICATED_PATCH | Freq: Once | TRANSDERMAL | Status: DC | PRN
Start: 2016-06-09 — End: 2016-06-09

## 2016-06-09 MED ORDER — FENTANYL CITRATE (PF) 100 MCG/2ML IJ SOLN
50.0000 ug | INTRAMUSCULAR | Status: DC | PRN
  Administered 2016-06-09 (×2): 50 ug via INTRAVENOUS

## 2016-06-09 MED ORDER — SUGAMMADEX SODIUM 200 MG/2ML IV SOLN
INTRAVENOUS | Status: DC | PRN
  Administered 2016-06-09: 200 mg via INTRAVENOUS

## 2016-06-09 MED ORDER — FENTANYL CITRATE (PF) 100 MCG/2ML IJ SOLN
INTRAMUSCULAR | Status: AC
  Filled 2016-06-09: qty 2

## 2016-06-09 MED ORDER — GLYCOPYRROLATE 0.2 MG/ML IJ SOLN: 0.2000 mg | Freq: Once | INTRAMUSCULAR | Status: DC | PRN

## 2016-06-09 MED ORDER — CLINDAMYCIN PHOSPHATE 600 MG/50ML IV SOLN
INTRAVENOUS | Status: AC
  Filled 2016-06-09: qty 50

## 2016-06-09 MED ORDER — BUPIVACAINE HCL (PF) 0.25 % IJ SOLN
INTRAMUSCULAR | Status: AC
  Filled 2016-06-09: qty 30

## 2016-06-09 MED ORDER — SUGAMMADEX SODIUM 200 MG/2ML IV SOLN
INTRAVENOUS | Status: AC
  Filled 2016-06-09: qty 2

## 2016-06-09 MED ORDER — MIDAZOLAM HCL 2 MG/2ML IJ SOLN
1.0000 mg | INTRAMUSCULAR | Status: DC | PRN
  Administered 2016-06-09: 2 mg via INTRAVENOUS

## 2016-06-09 MED ORDER — ROCURONIUM BROMIDE 10 MG/ML (PF) SYRINGE
PREFILLED_SYRINGE | INTRAVENOUS | Status: DC | PRN
  Administered 2016-06-09: 40 mg via INTRAVENOUS

## 2016-06-09 MED ORDER — CHLORHEXIDINE GLUCONATE CLOTH 2 % EX PADS: 6.0000 | MEDICATED_PAD | Freq: Once | CUTANEOUS | Status: DC

## 2016-06-09 MED ORDER — ONDANSETRON HCL 4 MG/2ML IJ SOLN: 4.0000 mg | Freq: Once | INTRAMUSCULAR | Status: DC | PRN

## 2016-06-09 MED ORDER — DEXAMETHASONE SODIUM PHOSPHATE 10 MG/ML IJ SOLN
INTRAMUSCULAR | Status: DC | PRN
  Administered 2016-06-09: 10 mg via INTRAVENOUS

## 2016-06-09 MED ORDER — LIDOCAINE 2% (20 MG/ML) 5 ML SYRINGE
INTRAMUSCULAR | Status: AC
  Filled 2016-06-09: qty 5

## 2016-06-09 MED ORDER — PROPOFOL 10 MG/ML IV BOLUS
INTRAVENOUS | Status: DC | PRN
  Administered 2016-06-09: 200 mg via INTRAVENOUS

## 2016-06-09 MED ORDER — DEXAMETHASONE SODIUM PHOSPHATE 10 MG/ML IJ SOLN
INTRAMUSCULAR | Status: AC
  Filled 2016-06-09: qty 1

## 2016-06-09 MED ORDER — HYDROCODONE-ACETAMINOPHEN 5-325 MG PO TABS
ORAL_TABLET | ORAL | Status: AC
  Filled 2016-06-09: qty 1

## 2016-06-09 MED ORDER — ROCURONIUM BROMIDE 10 MG/ML (PF) SYRINGE
PREFILLED_SYRINGE | INTRAVENOUS | Status: AC
  Filled 2016-06-09: qty 10

## 2016-06-09 MED ORDER — HYDROCODONE-ACETAMINOPHEN 5-325 MG PO TABS
1.0000 | ORAL_TABLET | Freq: Once | ORAL | Status: AC
  Administered 2016-06-09: 1 via ORAL

## 2016-06-09 MED ORDER — HYDROCODONE-ACETAMINOPHEN 5-325 MG PO TABS: 1.0000 | ORAL_TABLET | Freq: Four times a day (QID) | ORAL | 0 refills | Status: AC | PRN

## 2016-06-09 MED ORDER — ONDANSETRON HCL 4 MG/2ML IJ SOLN
INTRAMUSCULAR | Status: AC
  Filled 2016-06-09: qty 2

## 2016-06-09 MED ORDER — MIDAZOLAM HCL 2 MG/2ML IJ SOLN
INTRAMUSCULAR | Status: AC
  Filled 2016-06-09: qty 2

## 2016-06-09 MED ORDER — LIDOCAINE 2% (20 MG/ML) 5 ML SYRINGE
INTRAMUSCULAR | Status: DC | PRN
  Administered 2016-06-09: 60 mg via INTRAVENOUS

## 2016-06-09 MED ORDER — FENTANYL CITRATE (PF) 100 MCG/2ML IJ SOLN
25.0000 ug | INTRAMUSCULAR | Status: DC | PRN
  Administered 2016-06-09 (×2): 50 ug via INTRAVENOUS

## 2016-06-09 MED ORDER — LACTATED RINGERS IV SOLN
INTRAVENOUS | Status: DC
  Administered 2016-06-09 (×2): via INTRAVENOUS

## 2016-06-09 MED ORDER — CLINDAMYCIN PHOSPHATE 600 MG/50ML IV SOLN
600.0000 mg | Freq: Once | INTRAVENOUS | Status: AC
  Administered 2016-06-09: 600 mg via INTRAVENOUS

## 2016-06-09 MED ORDER — PROPOFOL 10 MG/ML IV BOLUS
INTRAVENOUS | Status: AC
  Filled 2016-06-09: qty 20

## 2016-06-09 MED ORDER — BUPIVACAINE HCL (PF) 0.25 % IJ SOLN
INTRAMUSCULAR | Status: DC | PRN
  Administered 2016-06-09: 6.5 mL

## 2016-06-09 MED ORDER — ONDANSETRON HCL 4 MG/2ML IJ SOLN
INTRAMUSCULAR | Status: DC | PRN
  Administered 2016-06-09: 4 mg via INTRAVENOUS

## 2016-06-09 SURGICAL SUPPLY — 46 items
BENZOIN TINCTURE PRP APPL 2/3 (GAUZE/BANDAGES/DRESSINGS) IMPLANT
BLADE CLIPPER SURG (BLADE) IMPLANT
BLADE SURG 10 STRL SS (BLADE) IMPLANT
BLADE SURG 15 STRL LF DISP TIS (BLADE) ×1 IMPLANT
BLADE SURG 15 STRL SS (BLADE) ×2
CANISTER SUCT 1200ML W/VALVE (MISCELLANEOUS) IMPLANT
CHLORAPREP W/TINT 26ML (MISCELLANEOUS) ×3 IMPLANT
CLOSURE WOUND 1/2 X4 (GAUZE/BANDAGES/DRESSINGS)
COVER BACK TABLE 60X90IN (DRAPES) ×3 IMPLANT
COVER MAYO STAND STRL (DRAPES) ×3 IMPLANT
DECANTER SPIKE VIAL GLASS SM (MISCELLANEOUS) IMPLANT
DERMABOND ADVANCED (GAUZE/BANDAGES/DRESSINGS) ×2
DERMABOND ADVANCED .7 DNX12 (GAUZE/BANDAGES/DRESSINGS) ×1 IMPLANT
DRAPE LAPAROTOMY TRNSV 102X78 (DRAPE) ×3 IMPLANT
DRAPE UTILITY XL STRL (DRAPES) ×3 IMPLANT
DRSG TEGADERM 4X4.75 (GAUZE/BANDAGES/DRESSINGS) IMPLANT
ELECT COATED BLADE 2.86 ST (ELECTRODE) ×3 IMPLANT
ELECT REM PT RETURN 9FT ADLT (ELECTROSURGICAL) ×3
ELECTRODE REM PT RTRN 9FT ADLT (ELECTROSURGICAL) ×1 IMPLANT
GLOVE BIOGEL PI IND STRL 8 (GLOVE) ×1 IMPLANT
GLOVE BIOGEL PI INDICATOR 8 (GLOVE) ×2
GLOVE ECLIPSE 8.0 STRL XLNG CF (GLOVE) ×3 IMPLANT
GOWN STRL REUS W/ TWL LRG LVL3 (GOWN DISPOSABLE) ×2 IMPLANT
GOWN STRL REUS W/TWL LRG LVL3 (GOWN DISPOSABLE) ×4
MESH VENTRALEX ST 1-7/10 CRC S (Mesh General) ×3 IMPLANT
NEEDLE HYPO 22GX1.5 SAFETY (NEEDLE) IMPLANT
NEEDLE HYPO 25X1 1.5 SAFETY (NEEDLE) ×3 IMPLANT
NS IRRIG 1000ML POUR BTL (IV SOLUTION) IMPLANT
PACK BASIN DAY SURGERY FS (CUSTOM PROCEDURE TRAY) ×3 IMPLANT
PENCIL BUTTON HOLSTER BLD 10FT (ELECTRODE) ×3 IMPLANT
SLEEVE SCD COMPRESS KNEE MED (MISCELLANEOUS) ×3 IMPLANT
STAPLER VISISTAT 35W (STAPLE) IMPLANT
STRIP CLOSURE SKIN 1/2X4 (GAUZE/BANDAGES/DRESSINGS) IMPLANT
SUT MON AB 4-0 PC3 18 (SUTURE) ×3 IMPLANT
SUT NOVA NAB DX-16 0-1 5-0 T12 (SUTURE) ×6 IMPLANT
SUT NOVA NAB GS-22 2 0 T19 (SUTURE) IMPLANT
SUT SILK 3 0 SH 30 (SUTURE) IMPLANT
SUT VIC AB 2-0 SH 27 (SUTURE) ×2
SUT VIC AB 2-0 SH 27XBRD (SUTURE) ×1 IMPLANT
SUT VICRYL 3-0 CR8 SH (SUTURE) ×3 IMPLANT
SYR CONTROL 10ML LL (SYRINGE) ×3 IMPLANT
TOWEL OR 17X24 6PK STRL BLUE (TOWEL DISPOSABLE) ×3 IMPLANT
TOWEL OR NON WOVEN STRL DISP B (DISPOSABLE) ×3 IMPLANT
TUBE CONNECTING 20'X1/4 (TUBING)
TUBE CONNECTING 20X1/4 (TUBING) IMPLANT
YANKAUER SUCT BULB TIP NO VENT (SUCTIONS) IMPLANT

## 2016-06-09 NOTE — Anesthesia Preprocedure Evaluation (Signed)
Anesthesia Evaluation  Patient identified by MRN, date of birth, ID band Patient awake    Reviewed: Allergy & Precautions, H&P , NPO status , Patient's Chart, lab work & pertinent test results  History of Anesthesia Complications Negative for: history of anesthetic complications  Airway Mallampati: II  TM Distance: >3 FB Neck ROM: full    Dental no notable dental hx.    Pulmonary neg pulmonary ROS,    Pulmonary exam normal breath sounds clear to auscultation       Cardiovascular negative cardio ROS Normal cardiovascular exam Rhythm:regular Rate:Normal     Neuro/Psych Seizures -,     GI/Hepatic negative GI ROS, Neg liver ROS,   Endo/Other  negative endocrine ROS  Renal/GU negative Renal ROS     Musculoskeletal   Abdominal   Peds  Hematology negative hematology ROS (+)   Anesthesia Other Findings   Reproductive/Obstetrics negative OB ROS                             Anesthesia Physical Anesthesia Plan  ASA: II  Anesthesia Plan: General   Post-op Pain Management:    Induction: Intravenous  Airway Management Planned: Oral ETT  Additional Equipment:   Intra-op Plan:   Post-operative Plan: Extubation in OR  Informed Consent: I have reviewed the patients History and Physical, chart, labs and discussed the procedure including the risks, benefits and alternatives for the proposed anesthesia with the patient or authorized representative who has indicated his/her understanding and acceptance.   Dental Advisory Given  Plan Discussed with: Anesthesiologist, CRNA and Surgeon  Anesthesia Plan Comments:         Anesthesia Quick Evaluation

## 2016-06-09 NOTE — Anesthesia Postprocedure Evaluation (Signed)
Anesthesia Post Note  Patient: Shaun Garcia  Procedure(s) Performed: Procedure(s) (LRB): UMBILICAL HERNIA REPAIR WITH MESH (N/A) INSERTION OF MESH (N/A)  Patient location during evaluation: PACU Anesthesia Type: General Level of consciousness: awake and alert Pain management: pain level controlled Vital Signs Assessment: post-procedure vital signs reviewed and stable Respiratory status: spontaneous breathing, nonlabored ventilation, respiratory function stable and patient connected to nasal cannula oxygen Cardiovascular status: blood pressure returned to baseline and stable Postop Assessment: no signs of nausea or vomiting Anesthetic complications: no    Last Vitals:  Vitals:   06/09/16 0959 06/09/16 1233  BP: 128/75 114/65  Pulse: (!) 58 (!) 56  Resp: 20   Temp: 36.6 C     Last Pain:  Vitals:   06/09/16 0959  TempSrc: Oral                 Zenaida Deed

## 2016-06-09 NOTE — Discharge Instructions (Signed)
[facility] _______[facility]  UMBILICAL OR INGUINAL HERNIA REPAIR: POST OP INSTRUCTIONS  Always review your discharge instruction sheet given to you by the facility where your surgery was performed. IF YOU HAVE DISABILITY OR FAMILY LEAVE FORMS, YOU MUST BRING THEM TO THE OFFICE FOR PROCESSING.   DO NOT GIVE THEM TO YOUR DOCTOR.  1. A  prescription for pain medication may be given to you upon discharge.  Take your pain medication as prescribed, if needed.  If narcotic pain medicine is not needed, then you may take acetaminophen (Tylenol) or ibuprofen (Advil) as needed. 2. Take your usually prescribed medications unless otherwise directed. If you need a refill on your pain medication, please contact your pharmacy.  They will contact our office to request authorization. Prescriptions will not be filled after 5 pm or on week-ends. 3. You should follow a light diet the first 24 hours after arrival home, such as soup and crackers, etc.  Be sure to include lots of fluids daily.  Resume your normal diet the day after surgery. 4.Most patients will experience some swelling and bruising around the umbilicus or in the groin and scrotum.  Ice packs and reclining will help.  Swelling and bruising can take several days to resolve.    Post Anesthesia Home Care Instructions  Activity: Get plenty of rest for the remainder of the day. A responsible adult should stay with you for 24 hours following the procedure.  For the next 24 hours, DO NOT: -Drive a car -Paediatric nurse -Drink alcoholic beverages -Take any medication unless instructed by your physician -Make any legal decisions or sign important papers.  Meals: Start with liquid foods such as gelatin or soup. Progress to regular foods as tolerated. Avoid greasy, spicy, heavy foods. If nausea and/or vomiting occur, drink only clear liquids until the nausea and/or vomiting subsides. Call your physician if vomiting continues.  Special  Instructions/Symptoms: Your throat may feel dry or sore from the anesthesia or the breathing tube placed in your throat during surgery. If this causes discomfort, gargle with warm salt water. The discomfort should disappear within 24 hours.  If you had a scopolamine patch placed behind your ear for the management of post- operative nausea and/or vomiting:  1. The medication in the patch is effective for 72 hours, after which it should be removed.  Wrap patch in a tissue and discard in the trash. Wash hands thoroughly with soap and water. 2. You may remove the patch earlier than 72 hours if you experience unpleasant side effects which may include dry mouth, dizziness or visual disturbances. 3. Avoid touching the patch. Wash your hands with soap and water after contact with the patch.    6. It is common to experience some constipation if taking pain medication after surgery.  Increasing fluid intake and taking a stool softener (such as Colace) will usually help or prevent this problem from occurring.  A mild laxative (Milk of Magnesia or Miralax) should be taken according to package directions if there are no bowel movements after 48 hours. 7. Unless discharge instructions indicate otherwise, you may remove your bandages 24-48 hours after surgery, and you may shower at that time.  You may have steri-strips (small skin tapes) in place directly over the incision.  These strips should be left on the skin for 7-10 days.  If your surgeon used skin glue on the incision, you may shower in 24 hours.  The glue will flake off over the next 2-3 weeks.  Any sutures or staples will be removed at the office during your follow-up visit. °8. ACTIVITIES:  You may resume regular (light) daily activities beginning the next day--such as daily self-care, walking, climbing stairs--gradually increasing activities as tolerated.  You may have sexual intercourse when it is comfortable.  Refrain from any heavy lifting or straining  until approved by your doctor. ° °a.You may drive when you are no longer taking prescription pain medication, you can comfortably wear a seatbelt, and you can safely maneuver your car and apply brakes. °b.RETURN TO WORK:   °_____________________________________________ ° °9.You should see your doctor in the office for a follow-up appointment approximately 2-3 weeks after your surgery.  Make sure that you call for this appointment within a day or two after you arrive home to insure a convenient appointment time. °10.OTHER INSTRUCTIONS: _________________________ °   _____________________________________ ° °WHEN TO CALL YOUR DOCTOR: °1. Fever over 101.0 °2. Inability to urinate °3. Nausea and/or vomiting °4. Extreme swelling or bruising °5. Continued bleeding from incision. °6. Increased pain, redness, or drainage from the incision ° °The clinic staff is available to answer your questions during regular business hours.  Please don’t hesitate to call and ask to speak to one of the nurses for clinical concerns.  If you have a medical emergency, go to the nearest emergency room or call 911.  A surgeon from [facility] is always on call at the hospital ° ° °[address], [address], [place], [place]  [address] ? ° [address] [address], [place], [place]   [address] °[phone] ? [phone] ? FAX [phone] °Web site: [email or web address] °

## 2016-06-09 NOTE — H&P (Signed)
Pre-op/Pre-procedure Orders Open    05/09/2016 [facility]  Shaun Luna, MD  General Surgery   Additional Documentation   Encounter Info:   Billing Info,   History,   Allergies,   Detailed Report     All Notes   H&P by Shaun Luna, MD at 05/09/2016 2:56 PM   Author: Erroll Luna, MD Author Type: Physician Filed: 05/09/2016 2:58 PM  Note Status: Signed Cosign: Cosign Not Required Encounter Date: 05/09/2016  Editor: Shaun Luna, MD (Physician)    Shaun Garcia 05/09/2016 2:36 PM Location: [facility] Patient #: [id number] DOB: Jan 19, 1976 Married / Language: [name] / Race: White Male  History of Present Illness Shaun Garcia A. Helmer Dull MD; 05/09/2016 2:51 PM) Patient words: umbilical hernia   Patient sent at the request of Dr.Dettinger for bulge at umbilicus. It has been present for 7 years. It is getting larger. It occurred while lifting some heavy tile he thinks. It is causing more discomfort especially with lifting, pushing or pulling. The area pops in and pops out at his umbilicus. There is been no drainage, redness or foul smell period.  The patient is a 40 year old male.   Other Problems Shaun Garcia, CMA; 05/09/2016 2:37 PM) Hypercholesterolemia Umbilical Hernia Repair  Past Surgical History Shaun Garcia, CMA; 05/09/2016 2:36 PM) Oral Surgery  Diagnostic Studies History Shaun Garcia, [place]; 05/09/2016 2:36 PM) Colonoscopy never  Allergies Shaun Garcia, CMA; 05/09/2016 2:37 PM) Penicillin V *PENICILLINS*  Medication History Shaun Garcia, CMA; 05/09/2016 2:37 PM) No Current Medications Medications Reconciled  Social History Shaun Garcia, CMA; 05/09/2016 2:37 PM) Alcohol use Occasional alcohol use. Caffeine use Coffee, Tea. No drug use Tobacco use Former smoker.  Family History Shaun Garcia, [place]; 05/09/2016 2:36 PM) Arthritis Father. Hypertension Father, Mother. Melanoma Father.     Review of  Systems Shaun Garcia CMA; 05/09/2016 2:37 PM) General Not Present- Appetite Loss, Chills, Fatigue, Fever, Night Sweats, Weight Gain and Weight Loss. Skin Not Present- Change in Wart/Mole, Dryness, Hives, Jaundice, New Lesions, Non-Healing Wounds, Rash and Ulcer. HEENT Not Present- Earache, Hearing Loss, Hoarseness, Nose Bleed, Oral Ulcers, Ringing in the Ears, Seasonal Allergies, Sinus Pain, Sore Throat, Visual Disturbances, Wears glasses/contact lenses and Yellow Eyes. Respiratory Not Present- Bloody sputum, Chronic Cough, Difficulty Breathing, Snoring and Wheezing. Breast Not Present- Breast Mass, Breast Pain, Nipple Discharge and Skin Changes. Cardiovascular Not Present- Chest Pain, Difficulty Breathing Lying Down, Leg Cramps, Palpitations, Rapid Heart Rate, Shortness of Breath and Swelling of Extremities. Gastrointestinal Not Present- Abdominal Pain, Bloating, Bloody Stool, Change in Bowel Habits, Chronic diarrhea, Constipation, Difficulty Swallowing, Excessive gas, Gets full quickly at meals, Hemorrhoids, Indigestion, Nausea, Rectal Pain and Vomiting. Male Genitourinary Not Present- Blood in Urine, Change in Urinary Stream, Frequency, Impotence, Nocturia, Painful Urination, Urgency and Urine Leakage. Musculoskeletal Not Present- Back Pain, Joint Pain, Joint Stiffness, Muscle Pain, Muscle Weakness and Swelling of Extremities. Neurological Not Present- Decreased Memory, Fainting, Headaches, Numbness, Seizures, Tingling, Tremor, Trouble walking and Weakness. Psychiatric Not Present- Anxiety, Bipolar, Change in Sleep Pattern, Depression, Fearful and Frequent crying. Endocrine Not Present- Cold Intolerance, Excessive Hunger, Hair Changes, Heat Intolerance, Hot flashes and New Diabetes. Hematology Not Present- Blood Thinners, Easy Bruising, Excessive bleeding, Gland problems, HIV and Persistent Infections.  Vitals Shaun Garcia CMA; 05/09/2016 2:37 PM) 05/09/2016 2:37 PM Weight: 177 lb Height:  66in Body Surface Area: 1.9 m Body Mass Index: 28.57 kg/m  Temp.: 98.91F(Temporal)  Pulse: 78 (Regular)  BP: 124/86 (Sitting, Left Arm, Standard)      Physical Exam (  Shaun Jansma A. Nitzia Perren MD; 05/09/2016 2:52 PM)  General Mental Status-Alert. General Appearance-Consistent with stated age. Hydration-Well hydrated. Voice-Normal.  Head and Neck Head-normocephalic, atraumatic with no lesions or palpable masses. Trachea-midline. Thyroid Gland Characteristics - normal size and consistency.  Chest and Lung Exam Chest and lung exam reveals -quiet, even and easy respiratory effort with no use of accessory muscles and on auscultation, normal breath sounds, no adventitious sounds and normal vocal resonance. Inspection Chest Wall - Normal. Back - normal.  Cardiovascular Cardiovascular examination reveals -normal heart sounds, regular rate and rhythm with no murmurs and normal pedal pulses bilaterally.  Abdomen Note: Reducible umbilical hernia. 3 cm in size. Soft nontender.  Neurologic Neurologic evaluation reveals -alert and oriented x 3 with no impairment of recent or remote memory. Mental Status-Normal.  Musculoskeletal Normal Exam - Left-Upper Extremity Strength Normal and Lower Extremity Strength Normal. Normal Exam - Right-Upper Extremity Strength Normal and Lower Extremity Strength Normal.    Assessment & Plan (Shaun Youse A. Tiwan Schnitker MD; [id number] PM)  UMBILICAL HERNIA WITHOUT OBSTRUCTION AND WITHOUT GANGRENE (K42.9) Impression: Discussed operative and nonoperative management of this problem. Pathophysiology discussed. Repair discussed with the use of mesh. The pros and cons of surgery discussed. Patient has opted to have his umbilical hernia repaired. The risk of hernia repair include bleeding, infection, organ injury, bowel injury, bladder injury, nerve injury recurrent hernia, blood clots, worsening of underlying condition,  chronic pain, mesh use, open surgery, death, and the need for other operattions. Pt agrees to proceed  Current Plans You are being scheduled for surgery - Our schedulers will call you.  You should hear from our office's scheduling department within 5 working days about the location, date, and time of surgery. We try to make accommodations for patient's preferences in scheduling surgery, but sometimes the OR schedule or the surgeon's schedule prevents [place] from making those accommodations.  If you have not heard from our office [phone]) in 5 working days, call the office and ask for your surgeon's nurse.  If you have other questions about your diagnosis, plan, or surgery, call the office and ask for your surgeon's nurse.  The anatomy & physiology of the abdominal wall was discussed. The pathophysiology of hernias was discussed. Natural history risks without surgery including progeressive enlargement, pain, incarceration, & strangulation was discussed. Contributors to complications such as smoking, obesity, diabetes, prior surgery, etc were discussed.  I feel the risks of no intervention will lead to serious problems that outweigh the operative risks; therefore, I recommended surgery to reduce and repair the hernia. I explained laparoscopic techniques with possible need for an open approach. I noted the probable use of mesh to patch and/or buttress the hernia repair  Risks such as bleeding, infection, abscess, need for further treatment, heart attack, death, and other risks were discussed. I noted a good likelihood this will help address the problem. Goals of post-operative recovery were discussed as well. Possibility that this will not correct all symptoms was explained. I stressed the importance of low-impact activity, aggressive pain control, avoiding constipation, & not pushing through pain to minimize risk of post-operative chronic pain or injury. Possibility of reherniation especially  with smoking, obesity, diabetes, immunosuppression, and other health conditions was discussed. We will work to minimize complications.  An educational handout further explaining the pathology & treatment options was given as well. Questions were answered. The patient expresses understanding & wishes to proceed with surgery.  Pt Education - Pamphlet Given - Hernia Surgery: discussed with patient and provided  information.

## 2016-06-09 NOTE — Interval H&P Note (Signed)
History and Physical Interval Note:  06/09/2016 10:55 AM  Shaun Garcia  has presented today for surgery, with the diagnosis of UMBILICAL HERNIA  The various methods of treatment have been discussed with the patient and family. After consideration of risks, benefits and other options for treatment, the patient has consented to  Procedure(s): UMBILICAL HERNIA REPAIR WITH MESH (N/A) INSERTION OF MESH (N/A) as a surgical intervention .  The patient's history has been reviewed, patient examined, no change in status, stable for surgery.  I have reviewed the patient's chart and labs.  Questions were answered to the patient's satisfaction.     Arsalan Brisbin A.

## 2016-06-09 NOTE — Transfer of Care (Signed)
Last Vitals:  Vitals:   06/09/16 0959  BP: 128/75  Pulse: (!) 58  Resp: 20  Temp: 36.6 C    Last Pain:  Vitals:   06/09/16 0959  TempSrc: Oral         Immediate Anesthesia Transfer of Care Note  Patient: Shaun Garcia  Procedure(s) Performed: Procedure(s) (LRB): UMBILICAL HERNIA REPAIR WITH MESH (N/A) INSERTION OF MESH (N/A)  Patient Location: PACU  Anesthesia Type: General  Level of Consciousness: awake, alert  and oriented  Airway & Oxygen Therapy: Patient Spontanous Breathing and Patient connected to face mask oxygen  Post-op Assessment: Report given to PACU RN and Post -op Vital signs reviewed and stable  Post vital signs: Reviewed and stable  Complications: No apparent anesthesia complications

## 2016-06-09 NOTE — Op Note (Signed)
Shaun Garcia [birth year] [id number] 06/09/2016  Preoperative diagnosis: umbilical hernia not incarcerated or strangulated   Postoperative diagnosis: same   Procedure: Umbilical Hernia Repair with mesh  Surgeon: Erroll Luna, MD, FACS  Anesthesia: GA combined with regional for post-op pain   Clinical History and Indications: Pt presents for repair of symptomatic umbilical hernia.  The risk of hernia repair include bleeding,  Infection,   Recurrence of the hernia,  Mesh use, chronic pain,  Organ injury,  Bowel injury,  Bladder injury,   nerve injury with numbness around the incision,  Death,  and worsening of preexisting  medical problems.  The alternatives to surgery have been discussed as well..  Long term expectations of both operative and non operative treatments have been discussed.   The patient agrees to proceed.  Procedure: The patient was seen in the preoperative area and the plans for the procedure reviewed again. He  had no further questions. I marked the area of the umbilicus as the operative site. He wishes to prodeed.  The patient was taken to the operating room and after satisfactory GENERAL  anesthesia had been obtained the area was clipped as needed, prepped and draped. The timeout was performed.  I used some local  anesthesia to help with postoperative pain management. This was infiltrated around the umbilical area and additional infiltrated as I worked.  A curvilinear incision was made on the inferior aspect of the umbilicus. The umbilical skin was elevated off of the hernia sac. The hernia sac was dissected free of the subcutaneous tissues.  The defect measured 2 cm   And had reducible preperitoneal fat.  A 4.3 cm  Ventralght circular mesh was used  And placed in the subfascial position and secured with 2 O novafil.  The fascia was closed with 2 0 novafil.    Once the repair was complete the incision was closed by using some 3-0 Vicryl subcutaneous and 4-0 Monocryl  subcuticular sutures.  The patient tolerated the procedure well. There were no operative complications. There was minimal blood loss. All counts were correct. He was taken to the PACU in satisfactory condition.  Erroll Luna, MD, FACS 06/09/2016 12:14 PM

## 2016-06-09 NOTE — Interval H&P Note (Signed)
History and Physical Interval Note:  06/09/2016 11:07 AM  Shaun Garcia  has presented today for surgery, with the diagnosis of UMBILICAL HERNIA  The various methods of treatment have been discussed with the patient and family. After consideration of risks, benefits and other options for treatment, the patient has consented to  Procedure(s): UMBILICAL HERNIA REPAIR WITH MESH (N/A) INSERTION OF MESH (N/A) as a surgical intervention .  The patient's history has been reviewed, patient examined, no change in status, stable for surgery.  I have reviewed the patient's chart and labs.  Questions were answered to the patient's satisfaction.     Keano Guggenheim A.

## 2016-06-09 NOTE — Anesthesia Procedure Notes (Signed)
Procedure Name: Intubation Date/Time: 06/09/2016 11:32 AM Performed by: Jillyn Hidden Pre-anesthesia Checklist: Patient identified, Emergency Drugs available, Suction available and Patient being monitored Patient Re-evaluated:Patient Re-evaluated prior to inductionOxygen Delivery Method: Circle system utilized Preoxygenation: Pre-oxygenation with 100% oxygen Intubation Type: IV induction Ventilation: Mask ventilation without difficulty Tube type: Oral Tube size: 8.0 mm Number of attempts: 1 Airway Equipment and Method: Stylet and LTA kit utilized Placement Confirmation: ETT inserted through vocal cords under direct vision,  positive ETCO2 and breath sounds checked- equal and bilateral Secured at: 22 cm Tube secured with: Tape Dental Injury: Teeth and Oropharynx as per pre-operative assessment

## 2016-06-10 ENCOUNTER — Encounter (HOSPITAL_BASED_OUTPATIENT_CLINIC_OR_DEPARTMENT_OTHER): Payer: Self-pay | Admitting: Surgery

## 2016-06-13 NOTE — Addendum Note (Signed)
Addendum  created 06/13/16 [id number] by Tawni Millers, CRNA   Charge Capture section accepted

## 2016-09-14 IMAGING — CR DG CHEST 2V
2 series · 2 of 2 positions shown · non-contrast
Comparison: None.

CLINICAL DATA: Cough for 3 days

EXAM:
CHEST  2 VIEW

[view not recorded (1 of 2)]
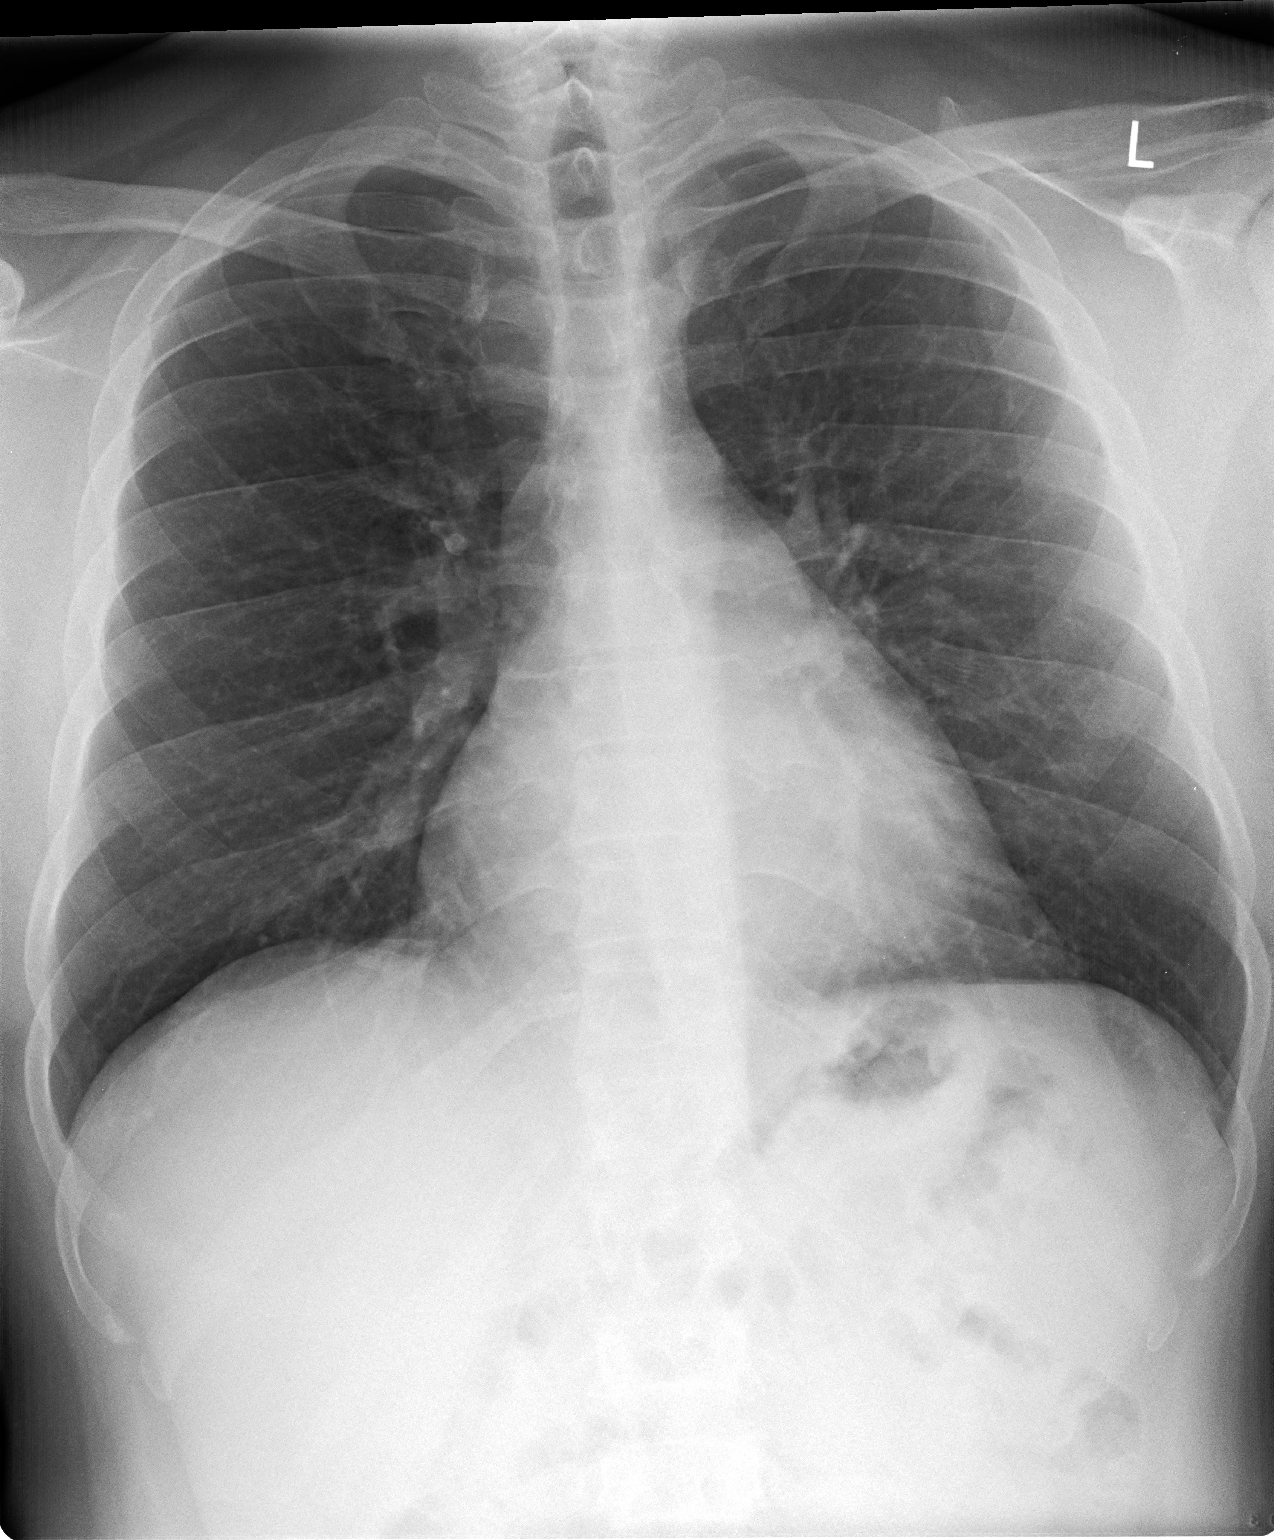

[view not recorded (2 of 2)]
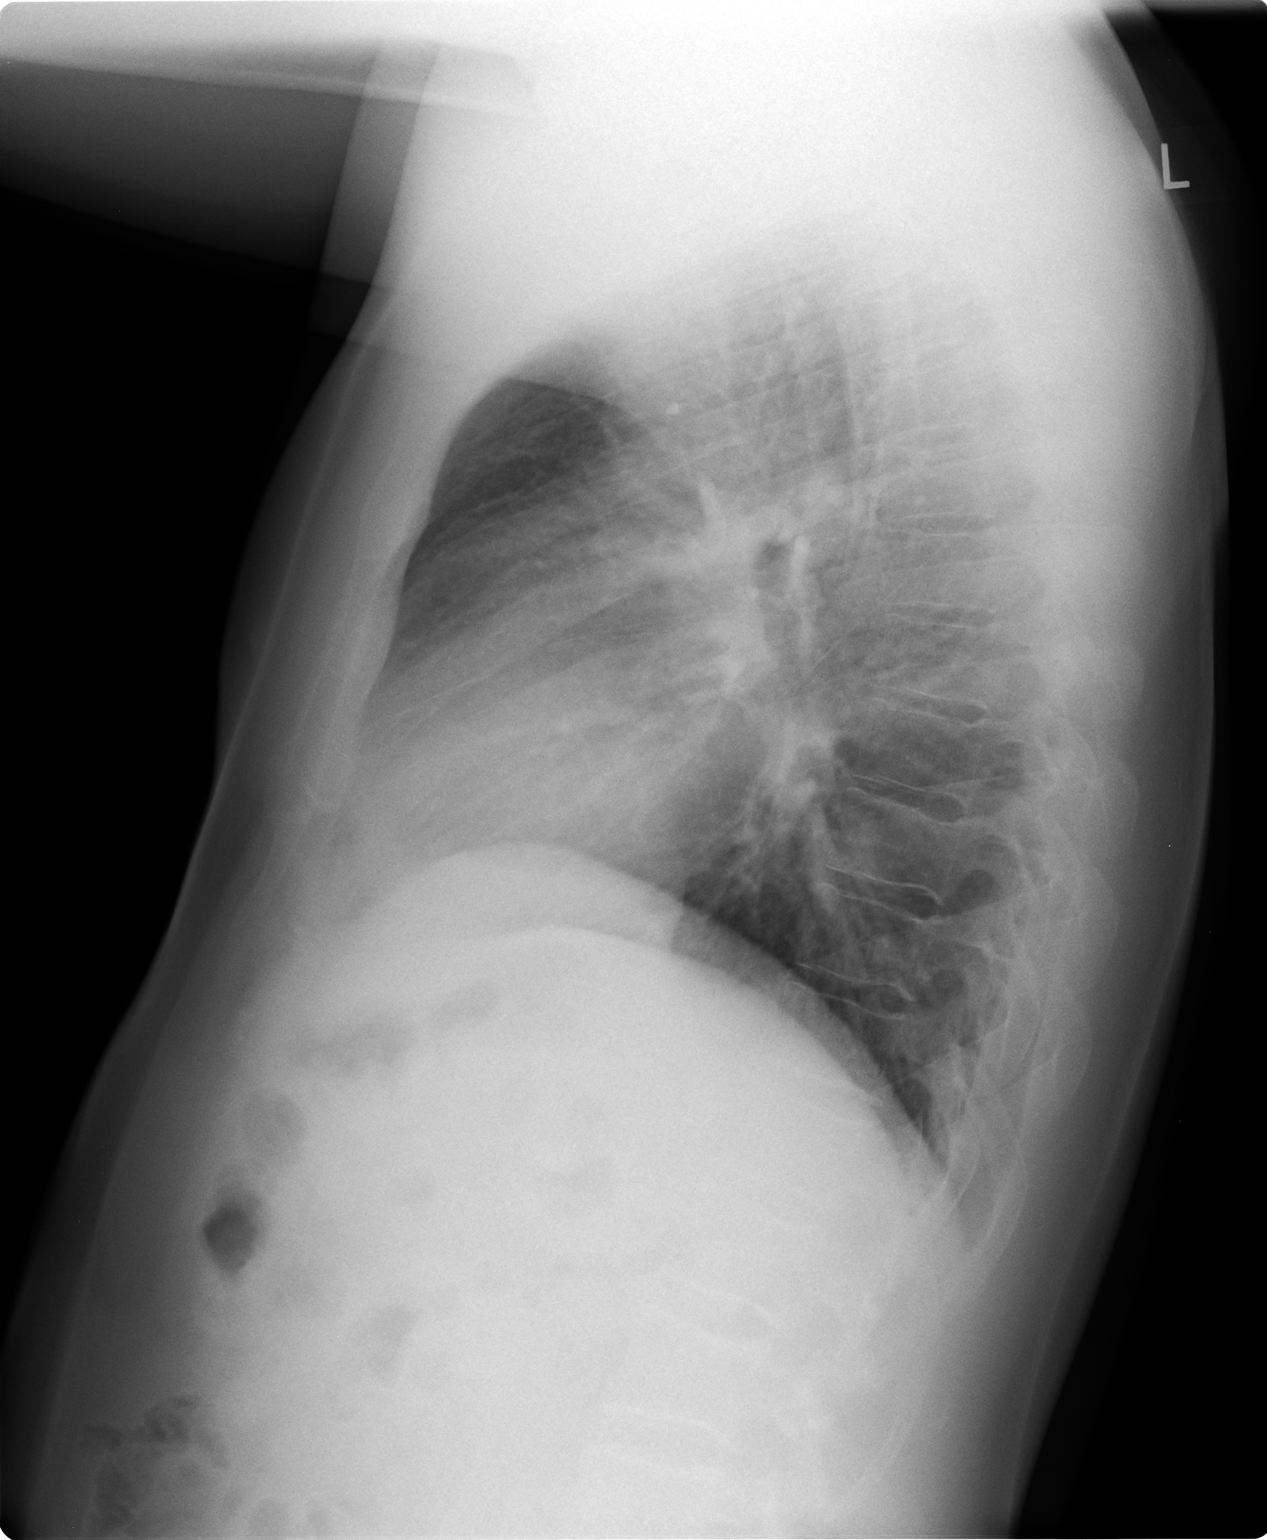

[2 of 2 positions shown; findings below may reference images not displayed]

FINDINGS: Lungs are clear. Heart size and pulmonary vascularity are normal. No
adenopathy. No bone lesions.
IMPRESSION: No edema or consolidation.
